# Patient Record
Sex: Female | Born: 1985 | Race: White | Hispanic: Yes | Smoking: Never smoker
Health system: Southern US, Community
[De-identification: ages and names within clinical notes are randomized; demographics above are authoritative.]

## PROBLEM LIST (undated history)

## (undated) DIAGNOSIS — Z8632 Personal history of gestational diabetes: Secondary | ICD-10-CM

## (undated) DIAGNOSIS — Z98891 History of uterine scar from previous surgery: Secondary | ICD-10-CM

## (undated) DIAGNOSIS — Z862 Personal history of diseases of the blood and blood-forming organs and certain disorders involving the immune mechanism: Secondary | ICD-10-CM

## (undated) DIAGNOSIS — A630 Anogenital (venereal) warts: Secondary | ICD-10-CM

## (undated) DIAGNOSIS — O98319 Other infections with a predominantly sexual mode of transmission complicating pregnancy, unspecified trimester: Secondary | ICD-10-CM

## (undated) DIAGNOSIS — I8390 Asymptomatic varicose veins of unspecified lower extremity: Secondary | ICD-10-CM

## (undated) DIAGNOSIS — O24419 Gestational diabetes mellitus in pregnancy, unspecified control: Secondary | ICD-10-CM

## (undated) DIAGNOSIS — R7611 Nonspecific reaction to tuberculin skin test without active tuberculosis: Secondary | ICD-10-CM

## (undated) HISTORY — DX: Nonspecific reaction to tuberculin skin test without active tuberculosis: R76.11

## (undated) HISTORY — DX: Other infections with a predominantly sexual mode of transmission complicating pregnancy, unspecified trimester: O98.319

## (undated) HISTORY — PX: OTHER SURGICAL HISTORY: SHX169

## (undated) HISTORY — DX: Anogenital (venereal) warts: A63.0

## (undated) HISTORY — DX: History of uterine scar from previous surgery: Z98.891

## (undated) HISTORY — DX: Gestational diabetes mellitus in pregnancy, unspecified control: O24.419

## (undated) HISTORY — DX: Asymptomatic varicose veins of unspecified lower extremity: I83.90

## (undated) HISTORY — DX: Personal history of diseases of the blood and blood-forming organs and certain disorders involving the immune mechanism: Z86.2

## (undated) HISTORY — DX: Personal history of gestational diabetes: Z86.32

---

## 2013-01-30 LAB — SICKLE CELL SCREEN: Sickle Cell Screen: NORMAL

## 2013-01-30 LAB — OB RESULTS CONSOLE VARICELLA ZOSTER ANTIBODY, IGG: Varicella: IMMUNE

## 2013-01-30 LAB — OB RESULTS CONSOLE RUBELLA ANTIBODY, IGM: Rubella: IMMUNE

## 2013-02-05 ENCOUNTER — Ambulatory Visit: Payer: Self-pay | Admitting: Family Medicine

## 2013-02-19 ENCOUNTER — Ambulatory Visit: Payer: Self-pay | Admitting: Family Medicine

## 2013-02-22 ENCOUNTER — Encounter: Payer: Self-pay | Admitting: Maternal & Fetal Medicine

## 2013-07-18 ENCOUNTER — Ambulatory Visit: Payer: Self-pay | Admitting: Advanced Practice Midwife

## 2013-08-08 ENCOUNTER — Ambulatory Visit: Payer: Self-pay | Admitting: Advanced Practice Midwife

## 2013-08-20 DIAGNOSIS — O24410 Gestational diabetes mellitus in pregnancy, diet controlled: Secondary | ICD-10-CM | POA: Insufficient documentation

## 2013-08-20 DIAGNOSIS — R7611 Nonspecific reaction to tuberculin skin test without active tuberculosis: Secondary | ICD-10-CM

## 2013-08-20 HISTORY — DX: Nonspecific reaction to tuberculin skin test without active tuberculosis: R76.11

## 2014-01-15 IMAGING — CR DG CHEST 1V
1 series · 1 of 1 positions shown · non-contrast
Comparison: none

REASON FOR EXAM: positive PPD / PREGNANT PT PLS SHIELD
COMMENTS:

[w chest pa]
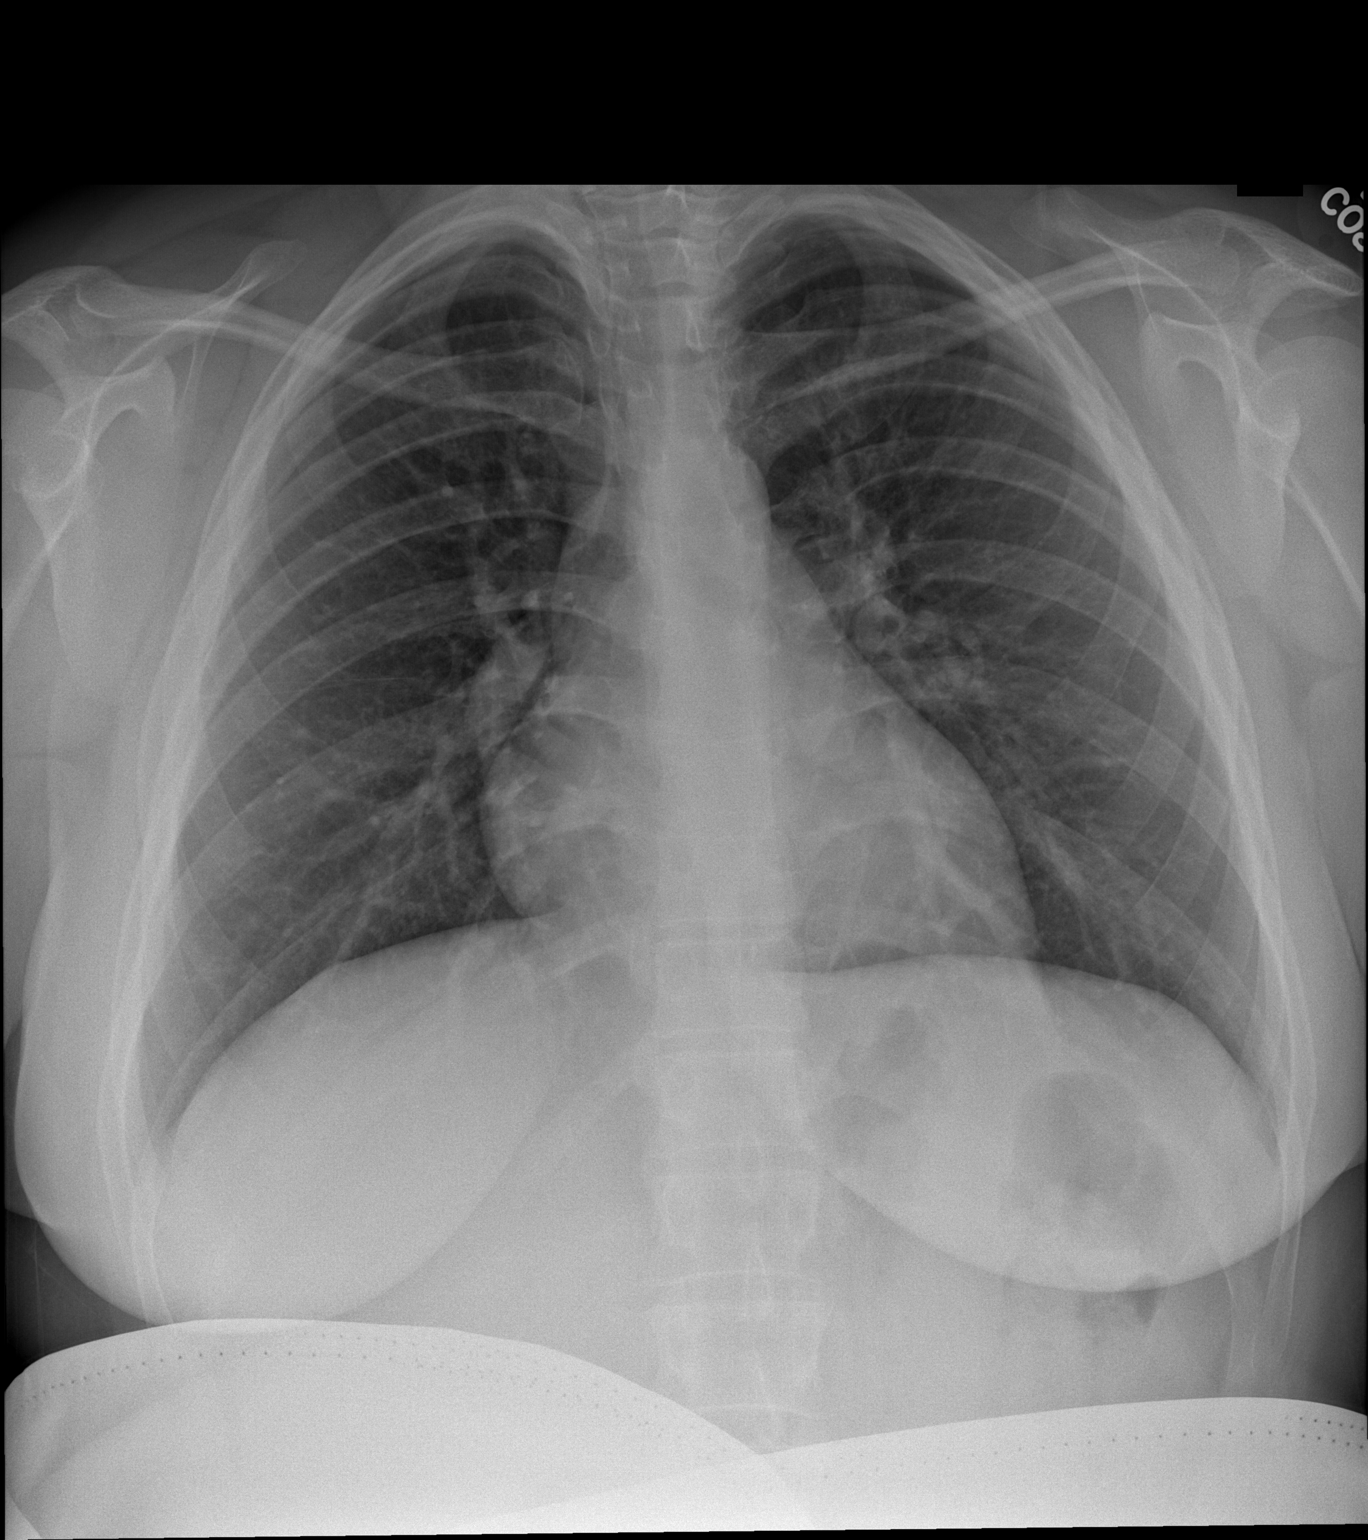

[1 of 1 positions shown; findings below may reference images not displayed]

PROCEDURE:     DXR - DXR CHEST 1 VIEWAP OR PA  - February 19, 2013  [DATE]

RESULT:     The lungs are adequately inflated. The interstitial markings are
coarse in the infrahilar region. There is no pleural effusion or alveolar
infiltrate. The cardiac silhouette is normal in size. The pulmonary
vascularity is not engorged.
IMPRESSION: There is no focal infiltrate. Coarse infrahilar lung
markings especially on the left are present and are nonspecific. A followup
PA and lateral chest x-ray would be of value.

[REDACTED]

## 2014-01-18 IMAGING — US US OB NUCHAL TRANSLUCENCY 1ST GEST - [facility] NRPT
1 series · 14 of 28 positions shown · non-contrast
Comparison: none

[Series 1: us ob nuchal translucency 1st gest - [facility] nrpt · 0.11mm/px · 14 of 35 slices shown]
[im 2/35]
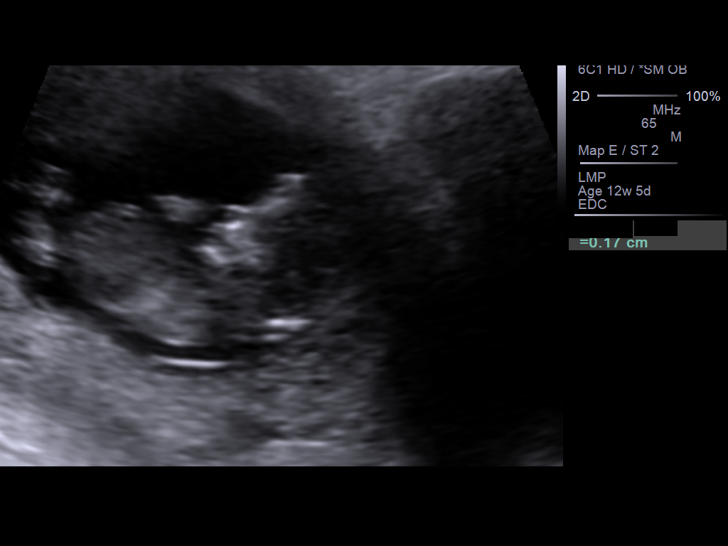
[im 4/35]
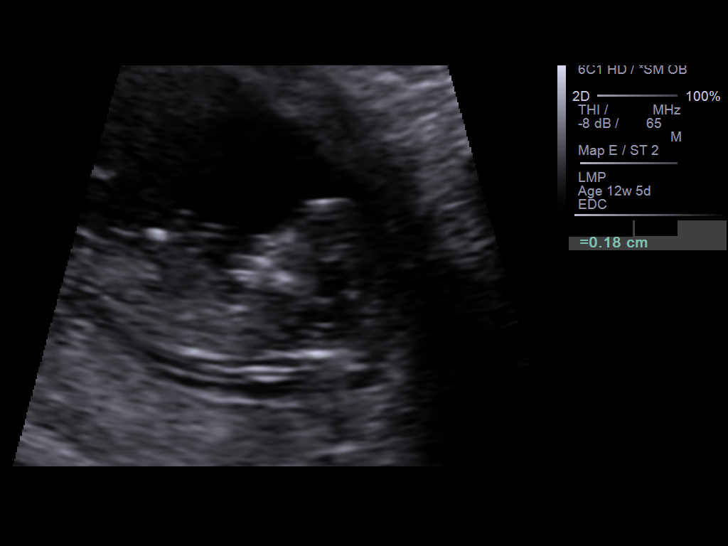
[im 7/35]
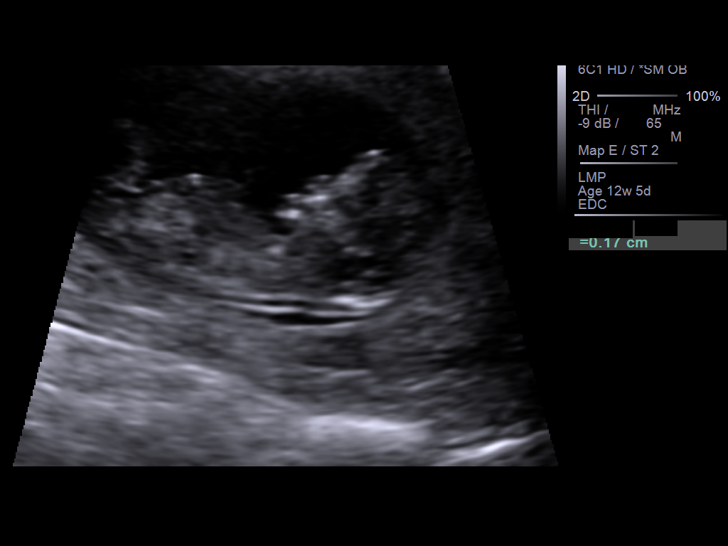
[im 9/35]
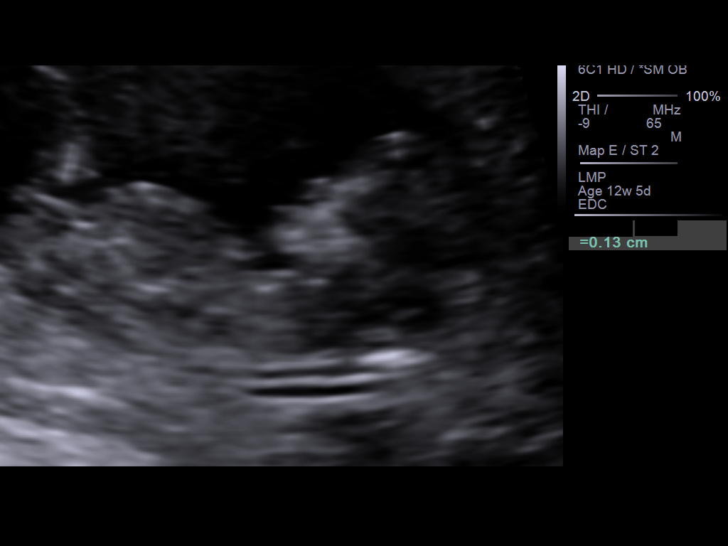
[im 12/35]
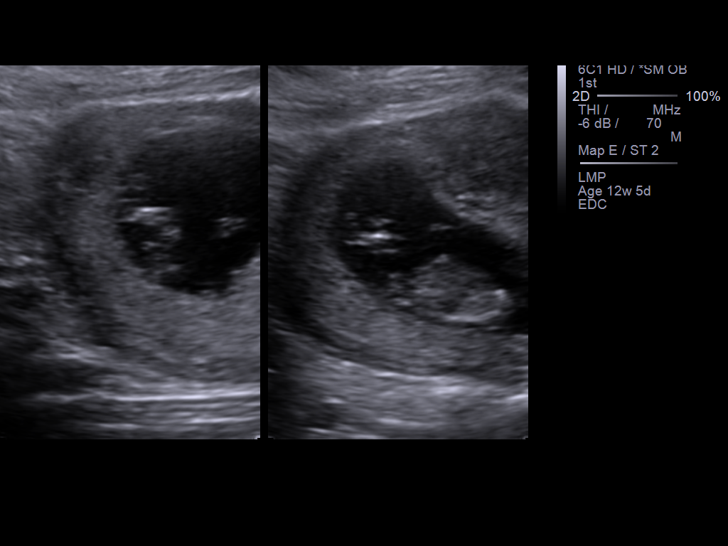
[im 14/35]
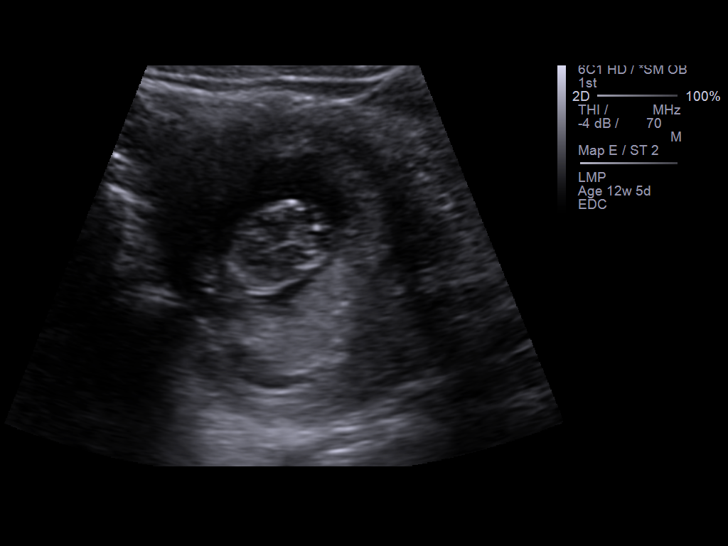
[im 17/35]
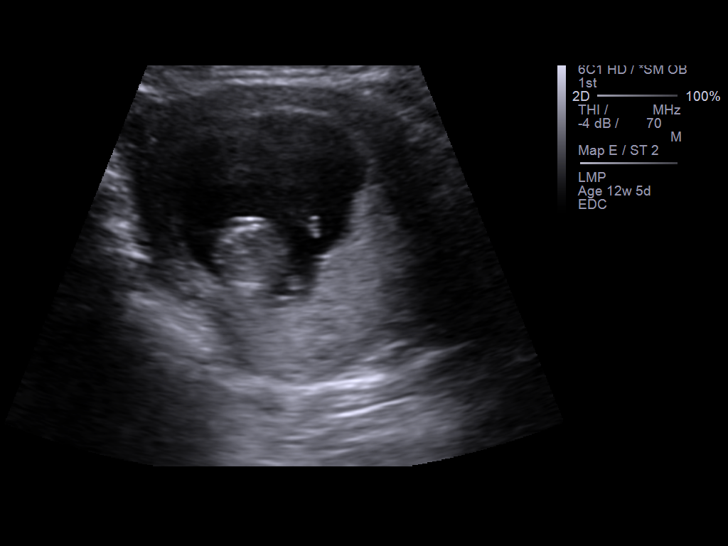
[im 19/35]
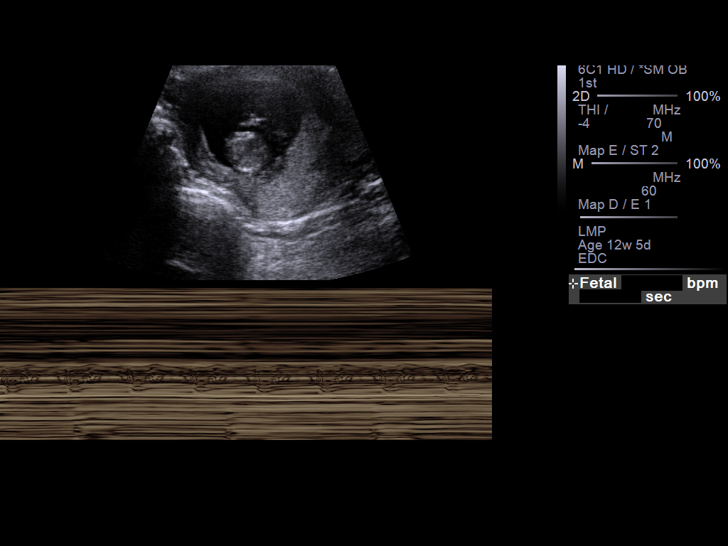
[im 22/35]
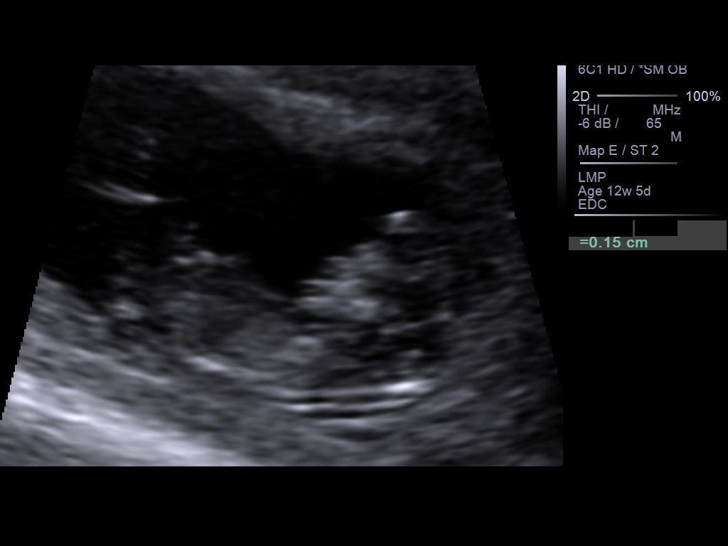
[im 24/35]
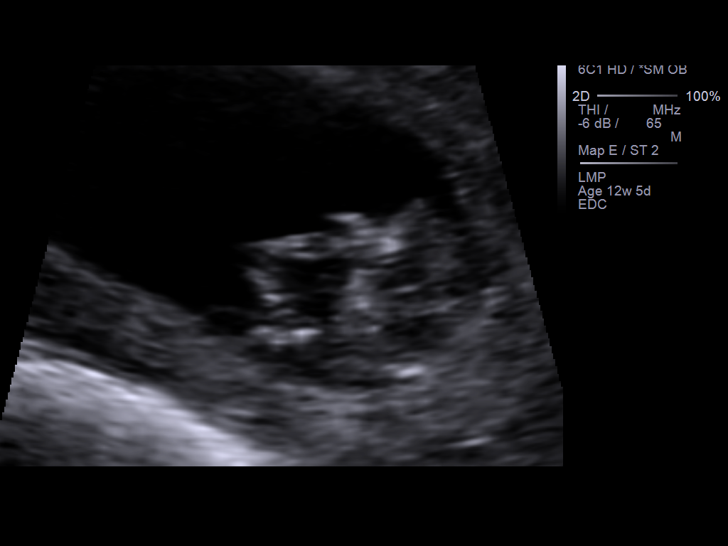
[im 27/35]
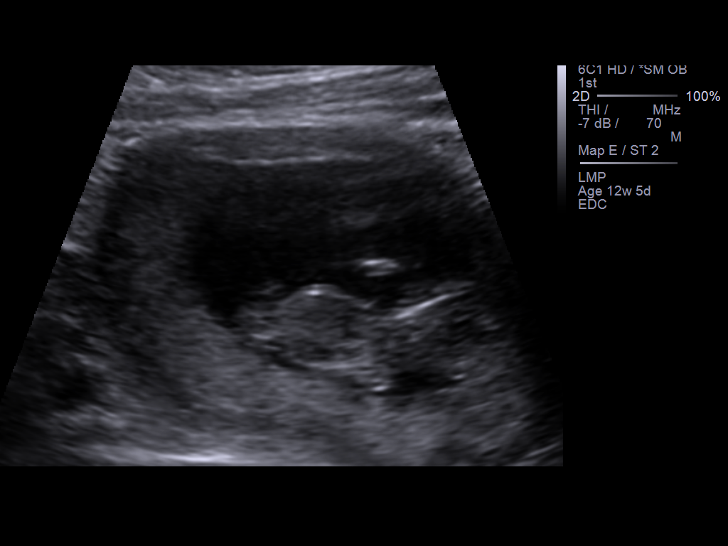
[im 29/35]
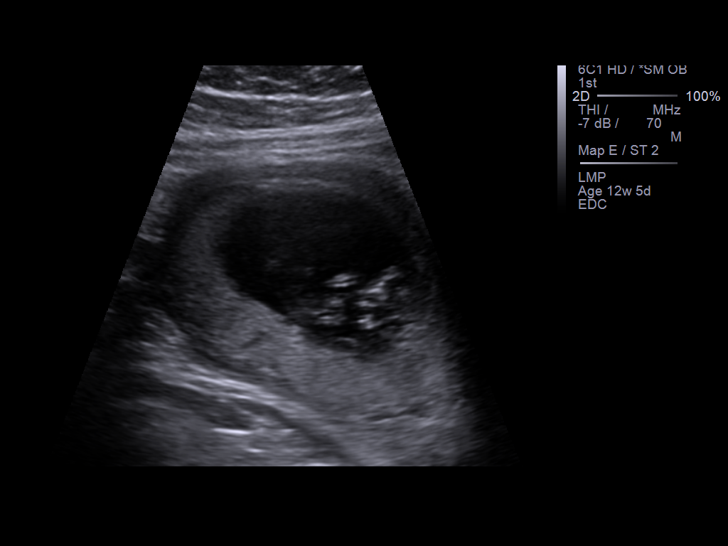
[im 32/35]
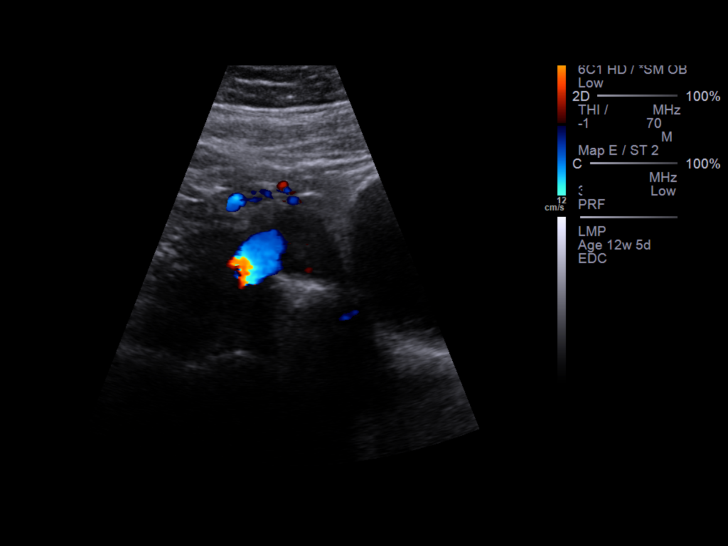
[im 35/35]
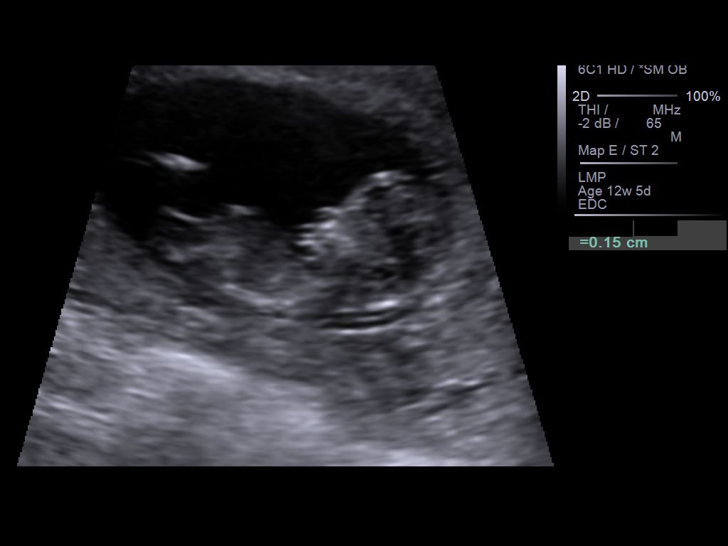

[14 of 28 positions shown; findings below may reference images not displayed]

IMAGES IMPORTED FROM THE SYNGO WORKFLOW SYSTEM
NO DICTATION FOR STUDY

## 2014-11-27 DIAGNOSIS — E669 Obesity, unspecified: Secondary | ICD-10-CM | POA: Insufficient documentation

## 2017-02-11 LAB — HM PAP SMEAR: HM Pap smear: NEGATIVE

## 2017-03-09 ENCOUNTER — Ambulatory Visit
Admission: RE | Admit: 2017-03-09 | Discharge: 2017-03-09 | Disposition: A | Discharge status: Home / Self Care | Payer: Self-pay | Source: Ambulatory Visit | Attending: Oncology | Admitting: Oncology

## 2017-03-09 ENCOUNTER — Ambulatory Visit: Payer: Self-pay | Attending: Oncology

## 2017-03-09 ENCOUNTER — Encounter (INDEPENDENT_AMBULATORY_CARE_PROVIDER_SITE_OTHER): Payer: Self-pay

## 2017-03-09 VITALS — BP 115/74 | HR 68 | Temp 97.2°F | Resp 18 | Ht 63.0 in | Wt 190.0 lb

## 2017-03-09 DIAGNOSIS — N644 Mastodynia: Secondary | ICD-10-CM

## 2017-03-09 NOTE — Progress Notes (Signed)
Subjective:     Patient ID: Jae DireKarina Del Rosario Stockard, female   DOB: 01/01/1986, 31 y.o.   MRN: [id number]  HPI   Review of Systems     Objective:   Physical Exam  Pulmonary/Chest: Right breast exhibits no inverted nipple, no mass, no nipple discharge, no skin change and no tenderness. Left breast exhibits tenderness. Left breast exhibits no inverted nipple, no mass, no nipple discharge and no skin change. Breasts are symmetrical.       Assessment:  31 year old hispanic patient presents for [facility] clinic visit.  [facility]. Patient screened, and meets [facility] eligibility.  Patient does not have insurance, Medicare or Medicaid.  Handout given on Affordable Care Act.  Instructed patient on breast self-exam using teach back method. Patient reports she has had pain in lower left breast x 3 months.  Describes as intermittent, but not cyclical.  No mass or lump palpated on exam.  Symmetrical fibroglandular breast tissue lower breasts.     Plan:     Sent for bilateral baseline diagnostic mammogram, and ultrasound.

## 2018-03-31 LAB — HM HIV SCREENING LAB: HM HIV Screening: NEGATIVE

## 2019-01-03 ENCOUNTER — Other Ambulatory Visit: Payer: Self-pay

## 2019-01-03 DIAGNOSIS — Z20822 Contact with and (suspected) exposure to covid-19: Secondary | ICD-10-CM

## 2019-01-04 LAB — SPECIMEN STATUS REPORT

## 2019-01-04 LAB — NOVEL CORONAVIRUS, NAA: SARS-CoV-2, NAA: NOT DETECTED

## 2019-05-10 ENCOUNTER — Ambulatory Visit: Payer: Self-pay | Attending: Internal Medicine

## 2019-05-10 DIAGNOSIS — Z20828 Contact with and (suspected) exposure to other viral communicable diseases: Secondary | ICD-10-CM | POA: Insufficient documentation

## 2019-05-10 DIAGNOSIS — Z20822 Contact with and (suspected) exposure to covid-19: Secondary | ICD-10-CM

## 2019-05-14 ENCOUNTER — Telehealth: Payer: Self-pay | Admitting: *Deleted

## 2019-05-14 NOTE — Telephone Encounter (Signed)
Patient called for results with assistance from interpreter Katie # [phone], advised results are still pending.

## 2019-05-15 ENCOUNTER — Telehealth: Payer: Self-pay

## 2019-05-15 NOTE — Telephone Encounter (Signed)
Pt called for results- pt was tested 05/10/19 at [facility]. Pt informed that specimens done on 05/10/19 was not picked up and pt must retest. Offered to make pt appointment but refused. Advised pt that [facility] and [facility] are doing rapid testing but she would need to make appt. Used Interpreter # [id number]. Pt verbalized understanding.

## 2019-05-15 NOTE — Telephone Encounter (Signed)
Caller advise that result not back yet  

## 2019-05-16 LAB — NOVEL CORONAVIRUS, NAA

## 2019-10-05 ENCOUNTER — Ambulatory Visit (LOCAL_COMMUNITY_HEALTH_CENTER): Payer: Self-pay

## 2019-10-05 ENCOUNTER — Other Ambulatory Visit: Payer: Self-pay

## 2019-10-05 VITALS — BP 125/79 | Ht 63.0 in | Wt 195.0 lb

## 2019-10-05 DIAGNOSIS — Z3201 Encounter for pregnancy test, result positive: Secondary | ICD-10-CM

## 2019-10-05 LAB — PREGNANCY, URINE: Preg Test, Ur: POSITIVE — AB

## 2019-10-05 MED ORDER — PRENATAL VITAMIN 27-0.8 MG PO TABS: 1.0000 | ORAL_TABLET | Freq: Every day | ORAL | 0 refills | Status: DC

## 2019-10-05 NOTE — Progress Notes (Signed)
Plans prenatal care at [facility] and plans preadmission today. Jossie Ng, RN

## 2019-10-27 ENCOUNTER — Emergency Department
Admission: EM | Admit: 2019-10-27 | Discharge: 2019-10-27 | Disposition: A | Discharge status: Home / Self Care | Payer: Self-pay | Attending: Emergency Medicine | Admitting: Emergency Medicine

## 2019-10-27 ENCOUNTER — Other Ambulatory Visit: Payer: Self-pay

## 2019-10-27 DIAGNOSIS — O98311 Other infections with a predominantly sexual mode of transmission complicating pregnancy, first trimester: Secondary | ICD-10-CM | POA: Insufficient documentation

## 2019-10-27 DIAGNOSIS — Z3A10 10 weeks gestation of pregnancy: Secondary | ICD-10-CM | POA: Insufficient documentation

## 2019-10-27 DIAGNOSIS — Z79899 Other long term (current) drug therapy: Secondary | ICD-10-CM | POA: Insufficient documentation

## 2019-10-27 DIAGNOSIS — A630 Anogenital (venereal) warts: Secondary | ICD-10-CM | POA: Insufficient documentation

## 2019-10-27 DIAGNOSIS — R300 Dysuria: Secondary | ICD-10-CM | POA: Insufficient documentation

## 2019-10-27 LAB — CHLAMYDIA/NGC RT PCR (ARMC ONLY)
Chlamydia Tr: NOT DETECTED
N gonorrhoeae: NOT DETECTED

## 2019-10-27 LAB — POCT PREGNANCY, URINE: Preg Test, Ur: POSITIVE — AB

## 2019-10-27 LAB — WET PREP, GENITAL
Clue Cells Wet Prep HPF POC: NONE SEEN
Sperm: NONE SEEN
Trich, Wet Prep: NONE SEEN
Yeast Wet Prep HPF POC: NONE SEEN

## 2019-10-27 LAB — URINALYSIS, COMPLETE (UACMP) WITH MICROSCOPIC
Bacteria, UA: NONE SEEN
Bilirubin Urine: NEGATIVE
Glucose, UA: NEGATIVE mg/dL
Hgb urine dipstick: NEGATIVE
Ketones, ur: NEGATIVE mg/dL
Leukocytes,Ua: NEGATIVE
Nitrite: NEGATIVE
Protein, ur: NEGATIVE mg/dL
Specific Gravity, Urine: 1.017 (ref 1.005–1.030)
pH: 6 (ref 5.0–8.0)

## 2019-10-27 NOTE — ED Provider Notes (Signed)
[facility] Emergency Department Provider Note  ____________________________________________   First MD Initiated Contact with Patient 10/27/19 1155     (approximate)  I have reviewed the triage vital signs and the nursing notes.   HISTORY  Chief Complaint Dysuria    HPI Sierra Dunlap is a 34 y.o. female presents emergency department complaining of pimples in the vaginal area.  States some itching and burning when she urinates.  Patient is [redacted] weeks pregnant.  No vaginal bleeding or cramping.  States she has a history of genital warts but that the doctor had gotten rid of them several years ago.  She denies fever or chills.    Past Medical History:  Diagnosis Date  . History of anemia    during pregnancy  . History of C-section    Scheduled due to size of baby from GDM  . History of gestational diabetes   . 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   Past Surgical History:  Procedure Laterality Date  . CESAREAN SECTION      Prior to Admission medications   Medication Sig Start Date End Date Taking? Authorizing Provider  Prenatal Vit-Fe Fumarate-FA (PRENATAL VITAMIN) 27-0.8 MG TABS Take 1 tablet by mouth daily at 6 (six) AM. 10/05/19   Federico Flake, MD    Allergies Patient has no known allergies.  Family History  Problem Relation Age of Onset  . Breast cancer Neg Hx     Social History Social History   Tobacco Use  . Smoking status: Never Smoker  . Smokeless tobacco: Never Used  Vaping Use  . Vaping Use: Never used  Substance Use Topics  . Alcohol use: Not Currently    Comment: Last ETOH use one year ago.  . Drug use: Never    Review of Systems  Constitutional: No fever/chills Eyes: No visual changes. ENT: No sore throat. Respiratory: Denies cough Cardiovascular: Denies chest pain Gastrointestinal: Denies abdominal  pain Genitourinary: Positive for dysuria.  Positive for bumps in the vaginal area Musculoskeletal: Negative for back pain. Skin: Negative for rash. Psychiatric: no mood changes,     ____________________________________________   PHYSICAL EXAM:  VITAL SIGNS: ED Triage Vitals  Enc Vitals Group     BP 10/27/19 1119 123/67     Pulse Rate 10/27/19 1119 75     Resp 10/27/19 1119 18     Temp 10/27/19 1119 98.8 F (37.1 C)     Temp Source 10/27/19 1119 Oral     SpO2 10/27/19 1119 99 %     Weight 10/27/19 1130 195 lb (88.5 kg)     Height 10/27/19 1130 5\' 2"  (1.575 m)     Head Circumference --      Peak Flow --      Pain Score 10/27/19 1130 7     Pain Loc --      Pain Edu? --      Excl. in GC? --     Constitutional: Alert and oriented. Well appearing and in no acute distress. Eyes: Conjunctivae are normal.  Head: Atraumatic. Nose: No congestion/rhinnorhea. Mouth/Throat: Mucous membranes are moist.   Neck:  supple no lymphadenopathy noted Cardiovascular: Normal rate, regular rhythm. Respiratory: Normal respiratory effort.  No retractions, Abd: soft nontender bs normal all 4 quad GU: External pelvic exam shows some warts along the perineum and heading towards the anal area.  No vesicles noted  in this area.  Right at the entrance of the vagina there are some sores which I did culture for herpes.  There is a yellowish discharge noted.  No cervical tenderness noted Musculoskeletal: FROM all extremities, warm and well perfused Neurologic:  Normal speech and language.  Skin:  Skin is warm, dry and intact. No rash noted. Psychiatric: Mood and affect are normal. Speech and behavior are normal.  ____________________________________________   LABS (all labs ordered are listed, but only abnormal results are displayed)  Labs Reviewed  WET PREP, GENITAL - Abnormal; Notable for the following components:      Result Value   WBC, Wet Prep HPF POC MANY (*)    All other components within  normal limits  URINALYSIS, COMPLETE (UACMP) WITH MICROSCOPIC - Abnormal; Notable for the following components:   Color, Urine YELLOW (*)    APPearance CLEAR (*)    All other components within normal limits  POCT PREGNANCY, URINE - Abnormal; Notable for the following components:   Preg Test, Ur POSITIVE (*)    All other components within normal limits  CHLAMYDIA/NGC RT PCR ([facility] ONLY)  HSV CULTURE AND TYPING  POC URINE PREG, ED   ____________________________________________   ____________________________________________  RADIOLOGY    ____________________________________________   PROCEDURES  Procedure(s) performed: No  Procedures    ____________________________________________   INITIAL IMPRESSION / ASSESSMENT AND PLAN / ED COURSE  Pertinent labs & imaging results that were available during my care of the patient were reviewed by me and considered in my medical decision making (see chart for details).   Patient is a 34 year old female who is [redacted] weeks pregnant presents emergency department with dysuria and vaginal sores.  See HPI  Physical exam shows some genital warts in the perineum and anal area.  Some other source noted at the entrance to the vagina which were cultured for herpes.  A scant yellow discharge is also noted.  GC/chlamydia ordered, wet prep, herpes culture and typing,  POC pregnancy is positive, urinalysis is normal  Wet prep results show white blood cells only For GC/chlamydia is negative Herpes culture is pending  I did explain the findings to the patient.  She is aware she has genital warts.  She should follow-up with [facility] who will be giving her prenatal care to address the genital warts.  For the itching she can take over-the-counter Benadryl, use ice pack if needed.  She states she understands.    Sierra Dunlap was evaluated in Emergency Department on 10/27/2019 for the symptoms described in the  history of present illness. She was evaluated in the context of the global COVID-19 pandemic, which necessitated consideration that the patient might be at risk for infection with the SARS-CoV-2 virus that causes COVID-19. Institutional protocols and algorithms that pertain to the evaluation of patients at risk for COVID-19 are in a state of rapid change based on information released by regulatory bodies including the CDC and federal and state organizations. These policies and algorithms were followed during the patient's care in the ED.   As part of my medical decision making, I reviewed the following data within the [place] notes reviewed and incorporated, Interpreter needed, Labs reviewed , Old chart reviewed, Notes from prior ED visits and  Controlled Substance Database  ____________________________________________   FINAL CLINICAL IMPRESSION(S) / ED DIAGNOSES  Final diagnoses:  Genital warts      NEW MEDICATIONS STARTED DURING THIS VISIT:  Discharge Medication List as  of 10/27/2019  2:25 PM       Note:  This document was prepared using Dragon voice recognition software and may include unintentional dictation errors.    Faythe Ghee, PA-C 10/27/19 1511    Chesley Noon, MD 10/28/19 [phone]

## 2019-10-27 NOTE — ED Notes (Signed)
Spanish interpreter at bedside with PA-C.

## 2019-10-27 NOTE — ED Notes (Signed)
Will defer physical assessment to PA-C. Pelvic exam performed with female ED tech as chaperone.

## 2019-10-27 NOTE — ED Triage Notes (Signed)
Pt states she has "a lot of pimples to vaginal area". States [redacted] weeks pregnant. States burns when she urinates. A&O, ambulatory. Denies vaginal bleeding or discharge. Sees OB 7/6.

## 2019-10-30 LAB — HSV CULTURE AND TYPING

## 2019-11-08 NOTE — Progress Notes (Signed)
Record abstracted per [facility] record and 11/06/19 phone interview R. Financial risk analyst, RN; Sharlette Dense, RN

## 2019-11-13 ENCOUNTER — Encounter: Payer: Self-pay | Admitting: Advanced Practice Midwife

## 2019-11-13 ENCOUNTER — Ambulatory Visit: Payer: Medicaid Other | Admitting: Advanced Practice Midwife

## 2019-11-13 ENCOUNTER — Other Ambulatory Visit: Payer: Self-pay

## 2019-11-13 DIAGNOSIS — O99211 Obesity complicating pregnancy, first trimester: Secondary | ICD-10-CM

## 2019-11-13 DIAGNOSIS — O0991 Supervision of high risk pregnancy, unspecified, first trimester: Secondary | ICD-10-CM

## 2019-11-13 DIAGNOSIS — A630 Anogenital (venereal) warts: Secondary | ICD-10-CM

## 2019-11-13 DIAGNOSIS — E669 Obesity, unspecified: Secondary | ICD-10-CM

## 2019-11-13 DIAGNOSIS — O99210 Obesity complicating pregnancy, unspecified trimester: Secondary | ICD-10-CM | POA: Insufficient documentation

## 2019-11-13 DIAGNOSIS — O09529 Supervision of elderly multigravida, unspecified trimester: Secondary | ICD-10-CM

## 2019-11-13 DIAGNOSIS — Z98891 History of uterine scar from previous surgery: Secondary | ICD-10-CM | POA: Diagnosis not present

## 2019-11-13 DIAGNOSIS — O0990 Supervision of high risk pregnancy, unspecified, unspecified trimester: Secondary | ICD-10-CM | POA: Insufficient documentation

## 2019-11-13 MED ORDER — PRENATAL VITAMINS 28-0.8 MG PO TABS: 1.0000 | ORAL_TABLET | ORAL | 0 refills | Status: AC

## 2019-11-13 NOTE — Progress Notes (Signed)
Here for Initial MH visit at 11.6 weeks. Is taking PNV, but needs more. Reports severe vaginal & rectal itching, lesions. 1hr glucose test.

## 2019-11-13 NOTE — Progress Notes (Signed)
[facility] HEALTH DEPT [facility] [address] [place] [address] [phone]  INITIAL PRENATAL VISIT NOTE  Subjective:  Sierra Dunlap is a 34 y.o.SHF nonsmoker G2P1001 at [redacted]w[redacted]d being seen today to start prenatal care at the [facility] Department. She feels "happy" about surprise pregnancy with no birth control the past 6 years.  34 yo employed FOB feels "happy" about pregnancy; he has a 14 yo daughter who lives with her mom in [place]; FOB is supportive and they have been in a 1 year relationship.  She is working 40 hrs/wk and lives with FOB and her 49 yo son.  Has been to the ER x 1 on 10/27/19 for a flare up of history of HPV; no u/s done.  Last pap 02/11/17 neg HPV neg.  Denies cigarettes, MJ.  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  Patient reports nausea.  Contractions: Not present. Vag. Bleeding: None.  Movement: Absent. Denies leaking of fluid.   Indications for ASA therapy (per uptodate) One of the following: Previous pregnancy with preeclampsia, especially early onset and with an adverse outcome No Multifetal gestation No Chronic hypertension No Type 1 or 2 diabetes mellitus No Chronic kidney disease No Autoimmune disease (antiphospholipid syndrome, systemic lupus erythematosus) No  Two or more of the following: Nulliparity No Obesity (body mass index >30 kg/m2) Yes Family history of preeclampsia in mother or sister  Yes Age ?35 years Yes Sociodemographic characteristics (African American race, low socioeconomic level) No Personal risk factors (eg, previous pregnancy with low birth weight or small for gestational age infant, previous adverse pregnancy outcome [eg, stillbirth], interval >10 years between pregnancies) No   The following portions of the patient's history were reviewed and updated as appropriate: allergies, current medications, past family history, past medical history, past social history, past surgical history and problem list. Problem list updated.  Objective:   Vitals:   11/13/19 1401  BP: 121/72  Pulse: 76  Temp: 98.6 F (37 C)  Weight: 198 lb 3.2 oz (89.9 kg)    Fetal Status: Fetal Heart Rate (bpm): not heard Fundal Height: 10 cm Movement: Absent  Presentation: Undeterminable  Physical Exam Vitals and nursing note reviewed.  Constitutional:      General: She is not in acute distress.    Appearance: Normal appearance. She is well-developed. She is obese.  HENT:     Head: Normocephalic and atraumatic.     Right Ear: External ear normal.     Left Ear: External ear normal.     Nose: Nose normal. No congestion or rhinorrhea.     Mouth/Throat:     Lips: Pink.     Mouth: Mucous membranes are moist.     Dentition: Normal dentition. No dental caries.     Pharynx: Oropharynx is clear. Uvula midline.  Eyes:     General: No scleral icterus.    Conjunctiva/sclera: Conjunctivae normal.  Neck:     Thyroid: No thyroid mass or thyromegaly.  Cardiovascular:  Rate and Rhythm: Normal rate.     Pulses: Normal pulses.     Comments: Extremities are warm and well perfused Pulmonary:     Effort: Pulmonary effort is normal.     Breath sounds: Normal breath sounds.  Chest:     Breasts: Breasts are symmetrical.        Right: Normal. No mass, nipple discharge or skin change.        Left: Normal. No mass, nipple discharge or skin change.  Abdominal:     Palpations: Abdomen is soft.      Tenderness: There is no abdominal tenderness.     Comments: Gravid, soft without tenderness, poor tone, increased adipose, no FHR heard, 10 wks size  Genitourinary:    General: Normal vulva.     Exam position: Lithotomy position.     Pubic Area: No rash.      Labia:        Right: No rash.        Left: No rash.      Vagina: Vaginal discharge (white creamy leukorrhea, ph>4.5) present.     Cervix: Normal.     Uterus: Normal. Enlarged (Gravid 10 wk size). Not tender.      Adnexa: Right adnexa normal and left adnexa normal.     Rectum: Normal. No external hemorrhoid.       Comments: HPV posterior aspect of introitus as well as surrounding anus Musculoskeletal:     Right lower leg: No edema.     Left lower leg: No edema.  Lymphadenopathy:     Upper Body:     Right upper body: No axillary adenopathy.     Left upper body: No axillary adenopathy.  Skin:    General: Skin is warm and dry.     Capillary Refill: Capillary refill takes less than 2 seconds.  Neurological:     Mental Status: She is alert.     Assessment and Plan:  Pregnancy: G2P1001 at [redacted]w[redacted]d  1. Obesity affecting pregnancy in first trimester Agrees to taking ASA 81 mg daily beginning 12 wks  2. Previous cesarean section 09/14/13 failed IOL with arrest of dilitation at 9 cm Desires TOLAC 3. Postpartum hemorrhage, unspecified type   4. Antepartum multigravida of advanced maternal age Desires genetic counseling, FIRST screen 5. Supervision of high risk pregnancy in first trimester C/o nausea--suggestions given for high protein snacks q 2 hours Referral written for FIRST screen with u/s and genetic counseling PHQ-9=9; declines counseling  - HCV Ab w Reflex to Quant PCR - HIV Antibody (routine testing w rflx) - Prenatal profile without Varicella/Rubella (440347) - Comprehensive metabolic panel - TSH - Hgb A1c w/o eAG - Glucose, 1 hour gestational - Protein / creatinine ratio, urine  (Spot) - Chlamydia/GC NAA,  Confirmation - Urine Culture - [id number] Drug Screen - QuantiFERON-TB Gold Plus - WET PREP FOR TRICH, YEAST, CLUE - Hemoglobin, venipuncture - Urinalysis (Urine Dip)  6. Condyloma external  Expectant management    Discussed overview of care and coordination with inpatient delivery practices including [facility], [facility], [facility] and [facility].   Reviewed Centering pregnancy as standard of care at [facility], oriented to room and showed video. Based on EDD, plan for Cycle    Preterm labor symptoms and general obstetric precautions including but not limited to vaginal bleeding, contractions, leaking of fluid and fetal movement were reviewed in detail with the patient.  Please refer to After Visit Summary for other counseling recommendations.   Return in about 4  weeks (around 12/11/2019) for routine PNC.  Future Appointments  Date Time Provider Department Center  12/11/2019 10:00 AM [facility] PROVIDER [facility] None    Alberteen Spindle, CNM

## 2019-11-14 LAB — 789231 7+OXYCODONE-BUND
Amphetamines, Urine: NEGATIVE ng/mL
BENZODIAZ UR QL: NEGATIVE ng/mL
Barbiturate screen, urine: NEGATIVE ng/mL
Cannabinoid Quant, Ur: NEGATIVE ng/mL
Cocaine (Metab.): NEGATIVE ng/mL
OPIATE SCREEN URINE: NEGATIVE ng/mL
Oxycodone/Oxymorphone, Urine: NEGATIVE ng/mL
PCP Quant, Ur: NEGATIVE ng/mL

## 2019-11-14 LAB — WET PREP FOR TRICH, YEAST, CLUE
Trichomonas Exam: NEGATIVE
Yeast Exam: NEGATIVE

## 2019-11-14 LAB — PROTEIN / CREATININE RATIO, URINE
Creatinine, Urine: 120.9 mg/dL
Protein, Ur: 8.6 mg/dL
Protein/Creat Ratio: 71 mg/g creat (ref 0–200)

## 2019-11-14 LAB — URINALYSIS
Bilirubin, UA: NEGATIVE
Glucose, UA: NEGATIVE
Ketones, UA: NEGATIVE
Leukocytes,UA: NEGATIVE
Nitrite, UA: NEGATIVE
Protein,UA: NEGATIVE
RBC, UA: NEGATIVE
Specific Gravity, UA: 1.03 (ref 1.005–1.030)
Urobilinogen, Ur: 0.2 mg/dL (ref 0.2–1.0)
pH, UA: 6 (ref 5.0–7.5)

## 2019-11-14 LAB — HEMOGLOBIN, FINGERSTICK: Hemoglobin: 12.3 g/dL (ref 11.1–15.9)

## 2019-11-15 LAB — URINE CULTURE

## 2019-11-16 LAB — COMPREHENSIVE METABOLIC PANEL
ALT: 17 IU/L (ref 0–32)
AST: 15 IU/L (ref 0–40)
Albumin/Globulin Ratio: 1.5 (ref 1.2–2.2)
Albumin: 4 g/dL (ref 3.8–4.8)
Alkaline Phosphatase: 53 IU/L (ref 48–121)
BUN/Creatinine Ratio: 16 (ref 9–23)
BUN: 7 mg/dL (ref 6–20)
Bilirubin Total: <0.2 mg/dL (ref 0.0–1.2)
CO2: 24 mmol/L (ref 20–29)
Calcium: 8.6 mg/dL — ABNORMAL LOW (ref 8.7–10.2)
Chloride: 103 mmol/L (ref 96–106)
Creatinine, Ser: 0.44 mg/dL — ABNORMAL LOW (ref 0.57–1.00)
GFR calc Af Amer: 151 mL/min/{1.73_m2} (ref >59)
GFR calc non Af Amer: 131 mL/min/{1.73_m2} (ref >59)
Globulin, Total: 2.6 g/dL (ref 1.5–4.5)
Glucose: 146 mg/dL — ABNORMAL HIGH (ref 65–99)
Potassium: 4.1 mmol/L (ref 3.5–5.2)
Sodium: 138 mmol/L (ref 134–144)
Total Protein: 6.6 g/dL (ref 6.0–8.5)

## 2019-11-16 LAB — QUANTIFERON-TB GOLD PLUS
QuantiFERON Mitogen Value: >10 IU/mL
QuantiFERON Nil Value: 0.23 IU/mL
QuantiFERON TB1 Ag Value: 0.34 IU/mL
QuantiFERON TB2 Ag Value: 0.36 IU/mL
QuantiFERON-TB Gold Plus: NEGATIVE

## 2019-11-16 LAB — CBC/D/PLT+RPR+RH+ABO+AB SCR
Antibody Screen: NEGATIVE
Basophils Absolute: 0 10*3/uL (ref 0.0–0.2)
Basos: 0 %
EOS (ABSOLUTE): 0.2 10*3/uL (ref 0.0–0.4)
Eos: 3 %
Hematocrit: 35.5 % (ref 34.0–46.6)
Hemoglobin: 12 g/dL (ref 11.1–15.9)
Hepatitis B Surface Ag: NEGATIVE
Immature Grans (Abs): 0 10*3/uL (ref 0.0–0.1)
Immature Granulocytes: 0 %
Lymphocytes Absolute: 2.4 10*3/uL (ref 0.7–3.1)
Lymphs: 26 %
MCH: 28.8 pg (ref 26.6–33.0)
MCHC: 33.8 g/dL (ref 31.5–35.7)
MCV: 85 fL (ref 79–97)
Monocytes Absolute: 0.5 10*3/uL (ref 0.1–0.9)
Monocytes: 5 %
Neutrophils Absolute: 6.1 10*3/uL (ref 1.4–7.0)
Neutrophils: 66 %
Platelets: 342 10*3/uL (ref 150–450)
RBC: 4.17 x10E6/uL (ref 3.77–5.28)
RDW: 13.4 % (ref 11.7–15.4)
RPR Ser Ql: NONREACTIVE
Rh Factor: POSITIVE
WBC: 9.3 10*3/uL (ref 3.4–10.8)

## 2019-11-16 LAB — HGB A1C W/O EAG: Hgb A1c MFr Bld: 5.8 % — ABNORMAL HIGH (ref 4.8–5.6)

## 2019-11-16 LAB — TSH: TSH: 1.39 u[IU]/mL (ref 0.450–4.500)

## 2019-11-16 LAB — GLUCOSE, 1 HOUR GESTATIONAL: Gestational Diabetes Screen: 126 mg/dL (ref 65–139)

## 2019-11-16 LAB — HCV AB W REFLEX TO QUANT PCR: HCV Ab: <0.1 s/co ratio (ref 0.0–0.9)

## 2019-11-16 LAB — HIV ANTIBODY (ROUTINE TESTING W REFLEX): HIV Screen 4th Generation wRfx: NONREACTIVE

## 2019-11-16 LAB — HCV INTERPRETATION

## 2019-11-17 LAB — CHLAMYDIA/GC NAA, CONFIRMATION
Chlamydia trachomatis, NAA: NEGATIVE
Neisseria gonorrhoeae, NAA: NEGATIVE

## 2019-12-11 ENCOUNTER — Ambulatory Visit: Payer: Self-pay

## 2019-12-12 ENCOUNTER — Other Ambulatory Visit: Payer: Self-pay

## 2019-12-12 ENCOUNTER — Ambulatory Visit: Payer: Medicaid Other | Admitting: Advanced Practice Midwife

## 2019-12-12 VITALS — BP 112/76 | HR 74 | Temp 99.9°F | Wt 202.0 lb

## 2019-12-12 DIAGNOSIS — O09529 Supervision of elderly multigravida, unspecified trimester: Secondary | ICD-10-CM

## 2019-12-12 DIAGNOSIS — O24410 Gestational diabetes mellitus in pregnancy, diet controlled: Secondary | ICD-10-CM | POA: Diagnosis not present

## 2019-12-12 DIAGNOSIS — O0991 Supervision of high risk pregnancy, unspecified, first trimester: Secondary | ICD-10-CM | POA: Diagnosis not present

## 2019-12-12 DIAGNOSIS — O99211 Obesity complicating pregnancy, first trimester: Secondary | ICD-10-CM | POA: Diagnosis not present

## 2019-12-12 DIAGNOSIS — Z98891 History of uterine scar from previous surgery: Secondary | ICD-10-CM | POA: Diagnosis not present

## 2019-12-12 NOTE — Progress Notes (Signed)
Pt is taking PNV. Pt denies visits to ER since last appt at [facility].

## 2019-12-12 NOTE — Progress Notes (Signed)
   PRENATAL VISIT NOTE  Subjective:  Sierra Dunlap is a 34 y.o. G2P1001 at [redacted]w[redacted]d being seen today for ongoing prenatal care.  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  Patient reports no complaints.  Contractions: Not present. Vag. Bleeding: None.  Movement: Absent. Denies leaking of fluid/ROM.   The following portions of the patient's history were reviewed and updated as appropriate: allergies, current medications, past family history, past medical history, past social history, past surgical history and problem list. Problem list updated.  Objective:   Vitals:   12/12/19 1348  BP: 112/76  Pulse: 74  Temp: 99.9 F (37.7 C)  Weight: 202 lb (91.6 kg)    Fetal Status: Fetal Heart Rate (bpm): 160 Fundal Height: 18 cm Movement: Absent     General:  Alert, oriented and cooperative. Patient is in no acute distress.  Skin: Skin is warm and dry. No rash noted.   Cardiovascular: Normal heart rate noted  Respiratory: Normal respiratory effort, no problems with respiration noted  Abdomen: Soft, gravid, appropriate for gestational age.  Pain/Pressure: Absent     Pelvic: Cervical exam deferred        Extremities: Normal range of motion.  Edema: None  Mental Status: Normal mood and affect. Normal behavior. Normal judgment and thought content.   Assessment and Plan:  Pregnancy: G2P1001 at [redacted]w[redacted]d  1. Previous cesarean section 09/14/13 failed IOL with arrest of dilitation @ 9 cm Desires TOLAC with  [facility]  2. Supervision of high risk pregnancy in first trimester Quad screen done today at [facility] u/s today with 3VC, AFI wnl, EFW=153 gm, anterior placenta at 16 3/7 wks Working 48 hrs/wk Genetic counseling done via t/c 12/11/19.   Here with FOB  3. Obesity affecting pregnancy in first trimester 7 lb (3.175 kg)  Taking ASA 81 mg daily 1 hr glucola wnl at last appt=126 on 11/13/19      Preterm labor symptoms and general obstetric precautions including but not limited to vaginal bleeding, contractions, leaking of fluid and fetal movement were reviewed in detail with the patient. Please refer to After Visit Summary for other counseling recommendations.  Return in about 4 weeks (around 01/09/2020) for routine PNC.  Future Appointments  Date Time Provider Department Center  01/09/2020  2:00 PM [facility] PROVIDER [facility] None    Alberteen Spindle, CNM

## 2020-01-09 ENCOUNTER — Other Ambulatory Visit: Payer: Self-pay

## 2020-01-09 ENCOUNTER — Ambulatory Visit: Payer: Self-pay | Admitting: Advanced Practice Midwife

## 2020-01-09 VITALS — BP 118/72 | HR 77 | Temp 98.5°F | Wt 205.2 lb

## 2020-01-09 DIAGNOSIS — O0991 Supervision of high risk pregnancy, unspecified, first trimester: Secondary | ICD-10-CM

## 2020-01-09 DIAGNOSIS — O99211 Obesity complicating pregnancy, first trimester: Secondary | ICD-10-CM

## 2020-01-09 DIAGNOSIS — Z98891 History of uterine scar from previous surgery: Secondary | ICD-10-CM

## 2020-01-09 DIAGNOSIS — O09529 Supervision of elderly multigravida, unspecified trimester: Secondary | ICD-10-CM

## 2020-01-09 NOTE — Progress Notes (Signed)
9

## 2020-01-09 NOTE — Progress Notes (Signed)
   PRENATAL VISIT NOTE  Subjective:  Sierra Dunlap is a 34 y.o. G2P1001 at [redacted]w[redacted]d being seen today for ongoing prenatal care.  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  Patient reports no complaints.  Contractions: Not present. Vag. Bleeding: None.  Movement: Present. Denies leaking of fluid/ROM.   The following portions of the patient's history were reviewed and updated as appropriate: allergies, current medications, past family history, past medical history, past social history, past surgical history and problem list. Problem list updated.  Objective:   Vitals:   01/09/20 1410  BP: 118/72  Pulse: 77  Temp: 98.5 F (36.9 C)  Weight: 205 lb 3.2 oz (93.1 kg)    Fetal Status: Fetal Heart Rate (bpm): 160 Fundal Height: 20 cm Movement: Present     General:  Alert, oriented and cooperative. Patient is in no acute distress.  Skin: Skin is warm and dry. No rash noted.   Cardiovascular: Normal heart rate noted  Respiratory: Normal respiratory effort, no problems with respiration noted  Abdomen: Soft, gravid, appropriate for gestational age.  Pain/Pressure: Absent     Pelvic: Cervical exam deferred        Extremities: Normal range of motion.  Edema: None  Mental Status: Normal mood and affect. Normal behavior. Normal judgment and thought content.   Assessment and Plan:  Pregnancy: G2P1001 at [redacted]w[redacted]d  1. Supervision of high risk pregnancy in first trimester 11/13/19 1 hour glucola=126 Reviewed  [facility] u/s 12/12/19 at 16 3/7 wks with 3VC, anterior placenta, AFI wnl No FIRST  screen results from 12/12/19 yet in Epic--neg Needs AFP only  2. Antepartum multigravida of advanced maternal age 25   3. Obesity affecting pregnancy in first trimester 10 lb 3.2 oz (4.627 kg) Not exercising--encouraged to do so Taking ASA 81 mg daily   4. Previous cesarean section 09/14/13 failed IOL with arrest of dilitation @ 9 cm Wants [facility] at [facility]    Preterm labor symptoms and general obstetric precautions including but not limited to vaginal bleeding, contractions, leaking of fluid and fetal movement were reviewed in detail with the patient. Please refer to After Visit Summary for other counseling recommendations.  Return in about 4 weeks (around 02/06/2020) for routine PNC.  No future appointments.  Alberteen Spindle, CNM

## 2020-01-10 ENCOUNTER — Telehealth: Payer: Self-pay

## 2020-01-10 NOTE — Telephone Encounter (Signed)
Per pink sticky note in Epic, client had quad screen drawn 12/12/2019. Blood obtained 12/12/2019 at [facility] was for [employer] NIPS with negative results. Call to client to offer AFP only and unable to return until 01/15/2020 - appt scheduled. Jossie Ng, RN

## 2020-01-15 ENCOUNTER — Other Ambulatory Visit: Payer: Self-pay

## 2020-01-20 ENCOUNTER — Other Ambulatory Visit: Payer: Self-pay

## 2020-01-20 ENCOUNTER — Observation Stay: Payer: Self-pay

## 2020-01-20 ENCOUNTER — Observation Stay
Admission: EM | Admit: 2020-01-20 | Discharge: 2020-01-20 | Disposition: A | Discharge status: Home / Self Care | Payer: Self-pay | Attending: Certified Nurse Midwife | Admitting: Certified Nurse Midwife

## 2020-01-20 ENCOUNTER — Encounter: Payer: Self-pay | Admitting: Obstetrics and Gynecology

## 2020-01-20 DIAGNOSIS — O24419 Gestational diabetes mellitus in pregnancy, unspecified control: Secondary | ICD-10-CM | POA: Insufficient documentation

## 2020-01-20 DIAGNOSIS — M545 Low back pain: Secondary | ICD-10-CM | POA: Insufficient documentation

## 2020-01-20 DIAGNOSIS — M549 Dorsalgia, unspecified: Secondary | ICD-10-CM | POA: Diagnosis present

## 2020-01-20 DIAGNOSIS — Z3A21 21 weeks gestation of pregnancy: Secondary | ICD-10-CM | POA: Insufficient documentation

## 2020-01-20 DIAGNOSIS — O99891 Other specified diseases and conditions complicating pregnancy: Principal | ICD-10-CM | POA: Insufficient documentation

## 2020-01-20 DIAGNOSIS — Z7982 Long term (current) use of aspirin: Secondary | ICD-10-CM | POA: Insufficient documentation

## 2020-01-20 HISTORY — DX: Dorsalgia, unspecified: M54.9

## 2020-01-20 LAB — URINALYSIS, ROUTINE W REFLEX MICROSCOPIC
Bacteria, UA: NONE SEEN
Bilirubin Urine: NEGATIVE
Glucose, UA: NEGATIVE mg/dL
Ketones, ur: 5 mg/dL — AB
Leukocytes,Ua: NEGATIVE
Nitrite: NEGATIVE
Protein, ur: NEGATIVE mg/dL
RBC / HPF: >50 RBC/hpf — ABNORMAL HIGH (ref 0–5)
Specific Gravity, Urine: 1.018 (ref 1.005–1.030)
pH: 6 (ref 5.0–8.0)

## 2020-01-20 MED ORDER — ACETAMINOPHEN 500 MG PO TABS: 1000.0000 mg | ORAL_TABLET | Freq: Once | ORAL | Status: AC

## 2020-01-20 MED ORDER — ACETAMINOPHEN 500 MG PO TABS: 1000.0000 mg | ORAL_TABLET | Freq: Four times a day (QID) | ORAL | 0 refills | Status: AC | PRN

## 2020-01-20 MED ORDER — ACETAMINOPHEN 500 MG PO TABS
ORAL_TABLET | ORAL | Status: AC
  Administered 2020-01-20: 1000 mg via ORAL
  Filled 2020-01-20: qty 2

## 2020-01-20 NOTE — Discharge Summary (Signed)
Sierra Dunlap is a 34 y.o. female. She is at [redacted]w[redacted]d gestation. Patient's last menstrual period was 08/22/2019 (exact date). Estimated Date of Delivery: 05/28/20  Prenatal care site:  [facility]   Chief complaint: abdominal and back pain Location: b/l back and abdomen Onset/timing: last night 2300 Duration: constant Quality: sharp Severity: moderate to severe Aggravating or alleviating conditions: none Associated signs/symptoms: pain during urination, blood spotting with urination Context: Patient reports a pain with abrupt onset around 2300 last night that woke her from her sleep. It was a sharp pain in her b/l lower back and wrapping around to her abdomen. Nothing in particular seems to make it better or worse, though it is improved now compared to what is was a couple of hours ago. She does report some blood spotting with wiping while using the bathroom and concerns for UTI, as she has pain with urination.   S: Resting comfortably.   She reports:  -active fetal movement -no leakage of fluid -no vaginal bleeding, some bloody spotting with wiping after urinating -no contractions  Maternal Medical History:   Past Medical History:  Diagnosis Date  . Genital warts complicating pregnancy, unspecified trimester    per 10/2019 ED visit  . Gestational diabetes    2015 pregnancy  . History of anemia    during pregnancy  . History of C-section    Scheduled due to size of baby from GDM  . History of gestational diabetes   . History of positive PPD, untreated    01/2013 PPD 86mm/Chest xray Neg/ Tx incomplete  . 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 Varicosities of leg     Past Surgical History:  Procedure Laterality Date  . CESAREAN SECTION     09/2013 @ [facility]  . History of unknown lap surgery     [place] 7 yr[place]    No Known Allergies  Prior to Admission medications   Medication Sig Start Date End Date Taking? Authorizing Provider   aspirin 81 MG chewable tablet Chew 81 mg by mouth daily.   Yes [provider]  Prenatal Vit-Fe Fumarate-FA (MULTIVITAMIN-PRENATAL) 27-0.8 MG TABS tablet Take 1 tablet by mouth daily at 12 noon.   Yes [provider]     Social History: She  reports that she has never smoked. She has never used smokeless tobacco. She reports previous alcohol use. She reports that she does not use drugs.  Family History: family history includes Diabetes in her mother; Heart disease in her maternal grandmother; Hypertension in her maternal grandmother; Thyroid disease in her mother.  Review of Systems: A full review of systems was performed and negative except as noted in the HPI.     O:  BP 123/67 (BP Location: Left Arm)   Pulse 73   Temp 98.7 F (37.1 C) (Oral)   Resp 18   LMP 08/22/2019 (Exact Date)  Results for orders placed or performed during the hospital encounter of 01/20/20 (from the past 48 hour(s))  Urinalysis, Routine w reflex microscopic Urine, Clean Catch   Collection Time: 01/20/20  1:56 AM  Result Value Ref Range   Color, Urine YELLOW (A) YELLOW   APPearance HAZY (A) CLEAR   Specific Gravity, Urine 1.018 1.005 - 1.030   pH 6.0 5.0 - 8.0   Glucose, UA NEGATIVE NEGATIVE mg/dL   Hgb urine dipstick LARGE (A) NEGATIVE   Bilirubin Urine NEGATIVE NEGATIVE   Ketones, ur 5 (A) NEGATIVE  mg/dL   Protein, ur NEGATIVE NEGATIVE mg/dL   Nitrite NEGATIVE NEGATIVE   Leukocytes,Ua NEGATIVE NEGATIVE   RBC / HPF >50 (H) 0 - 5 RBC/hpf   WBC, UA 6-10 0 - 5 WBC/hpf   Bacteria, UA NONE SEEN NONE SEEN   Squamous Epithelial / LPF 0-5 0 - 5   Mucus PRESENT      Constitutional: NAD, AAOx3  HE/ENT: extraocular movements grossly intact, moist mucous membranes CV: RRR Back: symmetric, no CVAT PULM: normal respiratory effort, CTABL     Abd: gravid, non-tender, non-distended, soft      Ext: Non-tender, Nonedmeatous   Psych: mood appropriate, speech normal Pelvic deferred  FHT:   140bpm by doppler   Toco: quiet Time: 1hour  A/P: 34 y.o. [redacted]w[redacted]d here for antenatal surveillance during pregnancy.  Principle diagnosis: back pain during pregnancy, resolved.  Labor  Not present  Fetal Wellbeing  Normal FHT by doppler, reassuring for GA  Back/abdominal pain  Pain improved after rest and Tylenol  UA with no evidence of UTI   Renal ultrasound with no evidence of kidney stone  Pain possibly due to kidney stone that she has passed, given RBCs and blood in UA, or muscular pain.   Reviewed management options for muscular pain and return precautions.  D/c home stable, precautions reviewed, follow-up as scheduled.    Genia Del 01/20/2020 4:27 AM  ----- Genia Del, CNM Certified Nurse Midwife [facility] Department of OB/GYN [facility]

## 2020-01-20 NOTE — OB Triage Note (Signed)
Pt arrived to unit with complaints of sharp pain 7/10 in her abdomen and back that awoke her from sleep. Pt notes pain during urination and " thinks she has a UTI"  (per interpreter). Notes fetal movement during the day. Denies LOF or foul discharge. Notes some bloodspotting when wiping after urinating. Continue to assess.

## 2020-02-08 ENCOUNTER — Telehealth: Payer: Self-pay

## 2020-02-08 ENCOUNTER — Ambulatory Visit: Payer: Self-pay

## 2020-02-08 NOTE — Telephone Encounter (Signed)
DNKA this pm as scheduled in [facility]. Call to client to reschedule appt and per client, she forgot appt. Appt rescheduled for 02/11/2020. Jossie Ng, RN

## 2020-02-11 ENCOUNTER — Ambulatory Visit: Payer: Self-pay | Admitting: Family Medicine

## 2020-02-11 ENCOUNTER — Encounter: Payer: Self-pay | Admitting: Family Medicine

## 2020-02-11 ENCOUNTER — Other Ambulatory Visit: Payer: Self-pay

## 2020-02-11 VITALS — BP 96/66 | HR 80 | Temp 98.4°F | Wt 207.4 lb

## 2020-02-11 DIAGNOSIS — Z98891 History of uterine scar from previous surgery: Secondary | ICD-10-CM

## 2020-02-11 DIAGNOSIS — O99212 Obesity complicating pregnancy, second trimester: Secondary | ICD-10-CM

## 2020-02-11 DIAGNOSIS — O09529 Supervision of elderly multigravida, unspecified trimester: Secondary | ICD-10-CM

## 2020-02-11 DIAGNOSIS — O0991 Supervision of high risk pregnancy, unspecified, first trimester: Secondary | ICD-10-CM

## 2020-02-11 DIAGNOSIS — O0992 Supervision of high risk pregnancy, unspecified, second trimester: Secondary | ICD-10-CM

## 2020-02-11 NOTE — Progress Notes (Addendum)
Patient here for MH RV at 24 2/7. CCNC and PHQ9 today. S/S PTL reviewed and literature given. Declined flu vaccine today and states she might want it at next visit. Patient wants to transfer care to [facility], patient sent to clerical to sign new consent and select delivering OB practice.Burt Knack, RN

## 2020-02-11 NOTE — Progress Notes (Signed)
   PRENATAL VISIT NOTE  Subjective:  Sierra Dunlap is a 34 y.o. G2P1001 at [redacted]w[redacted]d being seen today for ongoing prenatal care.  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  Patient reports no complaints.  Contractions: Not present. Vag. Bleeding: None.  Movement: Present. Denies leaking of fluid/ROM.   The following portions of the patient's history were reviewed and updated as appropriate: allergies, current medications, past family history, past medical history, past social history, past surgical history and problem list. Problem list updated.  Objective:   Vitals:   02/11/20 0832  BP: 96/66  Pulse: 80  Temp: 98.4 F (36.9 C)  Weight: 207 lb 6.4 oz (94.1 kg)    Fetal Status:     Movement: Present     General:  Alert, oriented and cooperative. Patient is in no acute distress.  Skin: Skin is warm and dry. No rash noted.   Cardiovascular: Normal heart rate noted  Respiratory: Normal respiratory effort, no problems with respiration noted  Abdomen: Soft, gravid, appropriate for gestational age.  Pain/Pressure: Absent     Pelvic: Cervical exam deferred        Extremities: Normal range of motion.  Edema: None  Mental Status: Normal mood and affect. Normal behavior. Normal judgment and thought content.   Assessment and Plan:  Pregnancy: G2P1001 at [redacted]w[redacted]d   1. Supervision of high risk pregnancy in first trimester -Unable to find anatomy US for this pt. She  had [place] at [facility] at 16 weeks, though per that report full anatomy evaluation precluded by GA. She did not know of another [place] scheduled. As such, I have placed a referral today for anatomy US at [facility].  -Desires to switch to [facility] - pt to fill out paperwork at front today.  -Missed appt for AFP to be drawn 9/7. NIPT normal, [place] w/no abnormalities. -States back pain has resolved.   2. Obesity affecting pregnancy in second trimester 12 lb 6.4 oz (5.625 kg)  3. Previous cesarean section 09/14/13 failed IOL with arrest of dilitation @ 9 cm -Desires TOLAC. Pt requests transfer to [facility] today. Will need delviery plans there at 34 wks.  4. Antepartum multigravida of advanced maternal age    Preterm labor symptoms and general obstetric precautions including but not limited to vaginal bleeding, contractions, leaking of fluid and fetal movement were reviewed in detail with the patient. Please refer to After Visit Summary for other counseling recommendations.  Return in about 4 weeks (around 03/10/2020) for routine prenatal care.  Future Appointments  Date Time Provider Department Center  03/07/2020  3:40 PM [facility] PROVIDER [facility] None    Ann Held, PA-C

## 2020-02-13 ENCOUNTER — Telehealth: Payer: Self-pay

## 2020-02-13 NOTE — Telephone Encounter (Signed)
Prior to scheduling [facility] anatomy US, verified with client via phone call that would need to take $100 to appt and would be billed balance of $100. Per client, that is acceptable. Call to [facility] to schedule appt and first available appt per [facility] is 02/26/20 at 3:45 pm (arrive 3:30 pm). Return call to client with appt and left message to call maternity regarding [place] appt and number to call provided. M. Yemen interpreted for both of above calls to client. Jossie Ng, RN

## 2020-02-15 NOTE — Telephone Encounter (Signed)
Call to client with [facility] Interpreter ID # [id number] and notified of [place] appt. Jossie Ng, RN

## 2020-02-27 ENCOUNTER — Telehealth: Payer: Self-pay | Admitting: Student

## 2020-02-27 NOTE — Telephone Encounter (Signed)
TC from patient to clinic with concerns for right eye twitching and tingling in face since yesterday. Also reports headache 6-7/10. Denies vision changes. Reports baby movements remain strong and consistent. Denies vaginal bleeding. E.S., CNM recommends for patient to go to emergency department to be evaluated. Patient verbalizes understanding. Sharlyne Pacas, RN

## 2020-03-07 ENCOUNTER — Ambulatory Visit: Payer: Self-pay | Admitting: Family Medicine

## 2020-03-07 ENCOUNTER — Other Ambulatory Visit: Payer: Self-pay

## 2020-03-07 ENCOUNTER — Encounter: Payer: Self-pay | Admitting: Family Medicine

## 2020-03-07 ENCOUNTER — Other Ambulatory Visit: Payer: Self-pay | Admitting: Family Medicine

## 2020-03-07 ENCOUNTER — Telehealth: Payer: Self-pay

## 2020-03-07 VITALS — BP 112/70 | HR 77 | Temp 98.2°F | Wt 208.2 lb

## 2020-03-07 DIAGNOSIS — O0993 Supervision of high risk pregnancy, unspecified, third trimester: Secondary | ICD-10-CM

## 2020-03-07 DIAGNOSIS — Z98891 History of uterine scar from previous surgery: Secondary | ICD-10-CM

## 2020-03-07 DIAGNOSIS — O09523 Supervision of elderly multigravida, third trimester: Secondary | ICD-10-CM

## 2020-03-07 DIAGNOSIS — Z23 Encounter for immunization: Secondary | ICD-10-CM

## 2020-03-07 DIAGNOSIS — O09529 Supervision of elderly multigravida, unspecified trimester: Secondary | ICD-10-CM

## 2020-03-07 DIAGNOSIS — Z3483 Encounter for supervision of other normal pregnancy, third trimester: Secondary | ICD-10-CM

## 2020-03-07 DIAGNOSIS — O99212 Obesity complicating pregnancy, second trimester: Secondary | ICD-10-CM

## 2020-03-07 DIAGNOSIS — K219 Gastro-esophageal reflux disease without esophagitis: Secondary | ICD-10-CM | POA: Insufficient documentation

## 2020-03-07 DIAGNOSIS — O99213 Obesity complicating pregnancy, third trimester: Secondary | ICD-10-CM

## 2020-03-07 HISTORY — DX: Gastro-esophageal reflux disease without esophagitis: K21.9

## 2020-03-07 NOTE — Progress Notes (Signed)
PRENATAL VISIT NOTE  Subjective:  Sierra Dunlap is a 34 y.o. G2P1001 at [redacted]w[redacted]d being seen today for ongoing prenatal care.  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  Patient reports heartburn.  Contractions: Not present. Vag. Bleeding: None.  Movement: Present. Denies leaking of fluid/ROM.   The following portions of the patient's history were reviewed and updated as appropriate: allergies, current medications, past family history, past medical history, past social history, past surgical history and problem list. Problem list updated.  Objective:   Vitals:   03/07/20 1602  BP: 112/70  Pulse: 77  Temp: 98.2 F (36.8 C)  Weight: 208 lb 3.2 oz (94.4 kg)    Fetal Status: Fetal Heart Rate (bpm): 155 Fundal Height: 32 cm Movement: Present     General:  Alert, oriented and cooperative. Patient is in no acute distress.  Skin: Skin is warm and dry. No rash noted.   Cardiovascular: Normal heart rate noted  Respiratory: Normal respiratory effort, no problems with respiration noted  Abdomen: Soft, gravid, appropriate for gestational age.  Pain/Pressure: Absent     Pelvic: Cervical exam deferred        Extremities: Normal range of motion.  Edema: None  Mental Status: Normal mood and affect. Normal behavior. Normal judgment and thought content.   Assessment and Plan:  Pregnancy: G2P1001 at [redacted]w[redacted]d    1. Supervision of high risk pregnancy in third  trimester -Too late in day for 28 wk labs - will RTC for RN visit next week for this -She has not yet had anatomy US. Was referred to [facility] but no showed appt d/t didn't have $100. We discussed going to [facility] for [place], but pt is adamant about not switching to [facility] and [facility] Korea are booking 6+ wks out. As such, I have referred to [facility] today for anatomy US and AFI/EFW for size>dates.  -Reviewed prenatal visit schedule q2 wks until 36 wks, then q1 wk.  -Discussed option of doula during delivery. [facility] pt: pamphlet given, advised pt to call for more information.  2. Antepartum multigravida of advanced maternal age -Genetic screening negative. Didn't get detailed anatomy US - referred today to [facility] (see #1)  3. Obesity affecting pregnancy in second trimester 13 lb 3.2 oz (5.987 kg)  4. Previous cesarean section 09/14/13 failed IOL with arrest of dilitation @ 9 cm -Desires TOLAC, will need delivery plans referral next visit  5. Gastroesophageal reflux disease, unspecified whether esophagitis present -Advised eating small meals and avoiding common trigger foods/drinks, elevating head at nighttime, not reclining immediately after eating, and using Tums as needed. If that fails advised trial of cimetidine (Tagamet HB) 200mg  twice per day. Handout with information given as well.     Preterm labor symptoms and general obstetric precautions including but not limited to vaginal bleeding, contractions, leaking of fluid and fetal movement were reviewed in detail with the patient. Please refer to After Visit Summary for other counseling recommendations.  Return in about 3 days (around 03/10/2020) for  28 wk labs, and again in 2 wks for provider visit.  Future Appointments  Date Time Provider Department Center  03/12/2020  8:30 AM [facility] NURSE [facility] None    Ann Held, PA-C

## 2020-03-07 NOTE — Progress Notes (Addendum)
Due to appt time, unable to do 28 week labs today. Client elected to return to Nurse Clinic next week for labs and appt scheduled. Appt reminder card given. [facility] [place] referral for anatomy US and S>D faxed witih confirmation received. Jossie Ng, RN

## 2020-03-07 NOTE — Telephone Encounter (Signed)
[facility] anatomy and S>D [place] scheduled for 03/13/2020 with arrival time of 12:30 pm. Call to client and left message to call regarding [place] appt. Number to call provided. [employer] (ID # [id number]) utilized during call. Jossie Ng, RN

## 2020-03-10 NOTE — Telephone Encounter (Signed)
Phone call to pt with interpreter Marlene Yemen. Pt informed of U/S appt at [facility] on 03/13/20, with arrival time of 12:30. Pt confirmed she will be able to go to appt.

## 2020-03-12 ENCOUNTER — Other Ambulatory Visit: Payer: Self-pay

## 2020-03-12 DIAGNOSIS — O0993 Supervision of high risk pregnancy, unspecified, third trimester: Secondary | ICD-10-CM

## 2020-03-12 DIAGNOSIS — O0991 Supervision of high risk pregnancy, unspecified, first trimester: Secondary | ICD-10-CM

## 2020-03-12 LAB — HEMOGLOBIN, FINGERSTICK: Hemoglobin: 11.8 g/dL (ref 11.1–15.9)

## 2020-03-12 NOTE — Progress Notes (Signed)
Sierra Dunlap interpreter today. Here for 28 wk labs- 1 hr glucola, hgb, HIV, RPR. Completed drink for 1 hr glucola at 8:30am. Instructions given to remain npo, stay on first floor, notify if n/v, and to go to lab at 9:25 am for lab draw at 9:30 am. To return to Nurse Clinic after lab draw to review hgb results. Pt returned to Nurse Clinic after lab draw. Hgb results 11.8 g/dl - WNL. Pt instructed to continue PNV and no additional  treatment indicated. Pt reports understanding. Jerel Shepherd, RN

## 2020-03-13 ENCOUNTER — Telehealth: Payer: Self-pay

## 2020-03-13 ENCOUNTER — Ambulatory Visit: Admission: RE | Admit: 2020-03-13 | Payer: Self-pay | Source: Ambulatory Visit

## 2020-03-13 ENCOUNTER — Encounter: Payer: Self-pay | Admitting: Advanced Practice Midwife

## 2020-03-13 DIAGNOSIS — R7309 Other abnormal glucose: Secondary | ICD-10-CM | POA: Insufficient documentation

## 2020-03-13 HISTORY — DX: Other abnormal glucose: R73.09

## 2020-03-13 LAB — HIV ANTIBODY (ROUTINE TESTING W REFLEX): HIV Screen 4th Generation wRfx: NONREACTIVE

## 2020-03-13 LAB — GLUCOSE, 1 HOUR GESTATIONAL: Gestational Diabetes Screen: 204 mg/dL — ABNORMAL HIGH (ref 65–139)

## 2020-03-13 LAB — RPR: RPR Ser Ql: NONREACTIVE

## 2020-03-13 NOTE — Telephone Encounter (Signed)
TC to patient to inform of elevated 1 hr gtt on 03/12/20 (204). Patient counseled on need to do 3 hour gtt asap and now scheduled for 03/18/2020. Patient counseled on fasting before test and procedure on day of test, questions answered. Interpreter M. Yemen.Burt Knack, RN

## 2020-03-13 NOTE — Progress Notes (Signed)
Consulted on the plan of care for this client.  I agree with the documented note and actions taken to provide care for this client.  E. Clance Baquero, CNM  

## 2020-03-18 ENCOUNTER — Other Ambulatory Visit: Payer: Self-pay

## 2020-03-18 ENCOUNTER — Encounter: Payer: Self-pay | Admitting: Family Medicine

## 2020-03-18 ENCOUNTER — Ambulatory Visit: Payer: Self-pay | Admitting: Family Medicine

## 2020-03-18 VITALS — BP 116/69 | HR 79 | Temp 98.6°F | Wt 207.8 lb

## 2020-03-18 DIAGNOSIS — O0990 Supervision of high risk pregnancy, unspecified, unspecified trimester: Secondary | ICD-10-CM

## 2020-03-18 DIAGNOSIS — O09523 Supervision of elderly multigravida, third trimester: Secondary | ICD-10-CM

## 2020-03-18 DIAGNOSIS — O99213 Obesity complicating pregnancy, third trimester: Secondary | ICD-10-CM

## 2020-03-18 DIAGNOSIS — O0991 Supervision of high risk pregnancy, unspecified, first trimester: Secondary | ICD-10-CM

## 2020-03-18 DIAGNOSIS — Z98891 History of uterine scar from previous surgery: Secondary | ICD-10-CM

## 2020-03-18 MED ORDER — PRENATAL VITAMIN 27-0.8 MG PO TABS: 1.0000 | ORAL_TABLET | Freq: Every day | ORAL | 0 refills | Status: DC

## 2020-03-18 NOTE — Progress Notes (Signed)
Patient here for MH RV at 29 6/7, and 3 hour today. Patient next lab draw 9:40. Patient went to 03/13/20 U/S but had child with her and they wouldn't do the U/S, needs to be rescheduled. Patient sent back to lab for next blood draw and will return to clinic for provider visit.Burt Knack, RN

## 2020-03-18 NOTE — Progress Notes (Signed)
PRENATAL VISIT NOTE  Subjective:  Sierra Dunlap is a 34 y.o. G2P1001 at [redacted]w[redacted]d being seen today for ongoing prenatal care.  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  Patient reports no complaints.  Contractions: Not present. Vag. Bleeding: None.  Movement: Present. Denies leaking of fluid/ROM.   The following portions of the patient's history were reviewed and updated as appropriate: allergies, current medications, past family history, past medical history, past social history, past surgical history and problem list. Problem list updated.  Objective:   Vitals:   03/18/20 0927  BP: 116/69  Pulse: 79  Temp: 98.6 F (37 C)  Weight: 207 lb 12.8 oz (94.3 kg)    Fetal Status: Fetal Heart Rate (bpm): 149 Fundal Height: 31 cm Movement: Present     General:  Alert, oriented and cooperative. Patient is in no acute distress.  Skin: Skin is warm and dry. No rash noted.   Cardiovascular: Normal heart rate noted  Respiratory: Normal respiratory effort, no problems with respiration noted  Abdomen: Soft, gravid, appropriate for gestational age.  Pain/Pressure: Absent     Pelvic: Cervical exam deferred        Extremities: Normal range of motion.  Edema: None  Mental Status: Normal mood and affect. Normal behavior. Normal judgment and thought content.   Assessment and Plan:  Pregnancy: G2P1001 at [redacted]w[redacted]d  1. Supervision of high risk pregnancy   - FH elevated, continued S>D. Discussed need for [place] and patient is unable to pay for [employer] [place]. RN will follow up with [facility] to get patient rescheduled for this exam.  - Glucose Tolerance Test, 6 Hour  2. Obesity affecting pregnancy in third trimester TWG=12 lb 12.8 oz (5.806 kg) which is within goal currently but patient is likely to overgain based on this trajectory.   3. Supervision of high risk pregnancy, antepartum  4. Multigravida of advanced maternal age in third trimester  5. Previous cesarean section 09/14/13 failed IOL with arrest of dilitation @ 9 cm - WIll get delivery plans appt   6. Third-stage postpartum hemorrhage LD aware   Preterm labor symptoms and general obstetric precautions including but not limited to vaginal bleeding, contractions, leaking of fluid and fetal movement were reviewed in detail with the patient. Please refer to After Visit Summary for other counseling recommendations.  Return in about 2 weeks (around 04/01/2020) for Routine prenatal care.  Future Appointments  Date Time Provider Department Center  03/25/2020  1:00 PM [facility] 3 [facility]  04/01/2020  4:00 PM [facility] PROVIDER [facility] None    Federico Flake, MD

## 2020-03-18 NOTE — Progress Notes (Signed)
Patient appointment interrupted by fire drill. Patient given more PNV today, and rescheduled [facility] U/S for 03/25/20 at 1:00. Per [facility] scheduling, patient counseled to go by herself, no partner or children allowed, and to go with full bladder. Patient states understanding.Marland KitchenMarland KitchenBurt Knack, RN

## 2020-03-19 ENCOUNTER — Other Ambulatory Visit: Payer: Self-pay | Admitting: Advanced Practice Midwife

## 2020-03-19 ENCOUNTER — Telehealth: Payer: Self-pay | Admitting: Advanced Practice Midwife

## 2020-03-19 ENCOUNTER — Encounter: Payer: Self-pay | Admitting: Advanced Practice Midwife

## 2020-03-19 DIAGNOSIS — Z8632 Personal history of gestational diabetes: Secondary | ICD-10-CM | POA: Insufficient documentation

## 2020-03-19 DIAGNOSIS — O24419 Gestational diabetes mellitus in pregnancy, unspecified control: Secondary | ICD-10-CM | POA: Insufficient documentation

## 2020-03-19 DIAGNOSIS — R7309 Other abnormal glucose: Secondary | ICD-10-CM

## 2020-03-19 LAB — GLUCOSE TOLERANCE TEST, 6 HOUR
Glucose, 1 Hour GTT: 201 mg/dL — ABNORMAL HIGH (ref 65–199)
Glucose, 2 hour: 176 mg/dL — ABNORMAL HIGH (ref 65–139)
Glucose, 3 hour: 147 mg/dL — ABNORMAL HIGH (ref 65–109)
Glucose, GTT - Fasting: 88 mg/dL (ref 65–99)

## 2020-03-19 MED ORDER — GLUCOSE BLOOD VI STRP: ORAL_STRIP | 12 refills | Status: DC

## 2020-03-19 MED ORDER — ACCU-CHEK SOFTCLIX LANCETS MISC: 12 refills | Status: DC

## 2020-03-19 NOTE — Telephone Encounter (Signed)
T/C via interpreter Marlene Yemen, to discuss new gestational diabetes dx, potential complications to self and fetus, now a high risk pregnancy, need for 2 Lifestyle appts, checking blood sugars 4x/day, weekly appts.  Questions answered; pt states understanding.

## 2020-03-21 ENCOUNTER — Ambulatory Visit: Payer: Self-pay

## 2020-03-21 ENCOUNTER — Encounter: Payer: Self-pay | Admitting: Family Medicine

## 2020-03-24 ENCOUNTER — Other Ambulatory Visit: Payer: Self-pay | Admitting: Family Medicine

## 2020-03-24 ENCOUNTER — Encounter: Payer: Self-pay | Admitting: Family Medicine

## 2020-03-24 DIAGNOSIS — O24410 Gestational diabetes mellitus in pregnancy, diet controlled: Secondary | ICD-10-CM

## 2020-03-25 ENCOUNTER — Other Ambulatory Visit: Payer: Self-pay

## 2020-03-25 ENCOUNTER — Ambulatory Visit
Admission: RE | Admit: 2020-03-25 | Discharge: 2020-03-25 | Disposition: A | Discharge status: Home / Self Care | Payer: Self-pay | Source: Ambulatory Visit | Attending: Family Medicine | Admitting: Family Medicine

## 2020-03-25 DIAGNOSIS — Z3483 Encounter for supervision of other normal pregnancy, third trimester: Secondary | ICD-10-CM | POA: Insufficient documentation

## 2020-04-01 ENCOUNTER — Encounter: Payer: Self-pay | Attending: Family Medicine | Admitting: *Deleted

## 2020-04-01 ENCOUNTER — Encounter: Payer: Self-pay | Admitting: *Deleted

## 2020-04-01 ENCOUNTER — Ambulatory Visit: Payer: Self-pay | Admitting: Family Medicine

## 2020-04-01 ENCOUNTER — Other Ambulatory Visit: Payer: Self-pay

## 2020-04-01 VITALS — BP 120/70 | Ht 62.0 in | Wt 211.0 lb

## 2020-04-01 DIAGNOSIS — O0993 Supervision of high risk pregnancy, unspecified, third trimester: Secondary | ICD-10-CM

## 2020-04-01 DIAGNOSIS — O24410 Gestational diabetes mellitus in pregnancy, diet controlled: Secondary | ICD-10-CM

## 2020-04-01 DIAGNOSIS — O0990 Supervision of high risk pregnancy, unspecified, unspecified trimester: Secondary | ICD-10-CM

## 2020-04-01 LAB — URINALYSIS
Bilirubin, UA: NEGATIVE
Glucose, UA: NEGATIVE
Ketones, UA: NEGATIVE
Leukocytes,UA: NEGATIVE
Nitrite, UA: NEGATIVE
Protein,UA: NEGATIVE
RBC, UA: NEGATIVE
Specific Gravity, UA: 1.02 (ref 1.005–1.030)
Urobilinogen, Ur: 0.2 mg/dL (ref 0.2–1.0)
pH, UA: 7 (ref 5.0–7.5)

## 2020-04-01 NOTE — Progress Notes (Signed)
Patient here for MH RV at 31 6/7. Urine dip today. Just started taking BS and has glucometer and a few lancets and strips. Needs prescription for more lancets and strips today. Pharmacy is [facility] on [address] Kick counts reviewed and cards given.Burt Knack, RN

## 2020-04-01 NOTE — Progress Notes (Signed)
PRENATAL VISIT NOTE  Subjective:  Sierra Dunlap is a 34 y.o. G2P1001 at [redacted]w[redacted]d being seen today for ongoing prenatal care.  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  Patient reports no complaints.  Contractions: Not present. Vag. Bleeding: None.  Movement: Present. Denies leaking of fluid/ROM.   The following portions of the patient's history were reviewed and updated as appropriate: allergies, current medications, past family history, past medical history, past social history, past surgical history and problem list. Problem list updated.  Objective:  There were no vitals filed for this visit.  Fetal Status: Fetal Heart Rate (bpm): 161 Fundal Height: 34 cm Movement: Present     General:  Alert, oriented and cooperative. Patient is in no acute distress.  Skin: Skin is warm and dry. No rash noted.   Cardiovascular: Normal heart rate noted  Respiratory: Normal respiratory effort, no problems with respiration noted  Abdomen: Soft, gravid, appropriate for gestational age.  Pain/Pressure: Absent     Pelvic: Cervical exam deferred        Extremities: Normal range of motion.  Edema: None  Mental Status: Normal mood and affect. Normal behavior. Normal judgment and thought content.   Assessment and Plan:  Pregnancy: G2P1001 at [redacted]w[redacted]d  1. Supervision of high risk pregnancy, antepartum   Discussed with  patient, importance of life style visits and control of GDM.  Need for U/S at 36 weeks and delivery at 40 weeks.     2. Gestational diabetes mellitus, class A1   Patient had first visit to lifestyles today.  Blood sugar log only has one value- 2hr  pp after lunch BG was 121.   Dicussed with patient the importance of keeping BG log and bringing to visits.    Encouraged continued daily exercise and diet modifications.   72 hr Recent food: Breakfast = 1  Tortilla, beans & cheese             Lunch = 1 tortilla, tomato & onions, cheese           Dinner =1 tortilla, beans & tomatos           Snack = 1/2 banana @11 :30 and pear @ 3:30 pm.   Water: 3-4 glasses of water and juice.    Patient informed that lifestyles discussed with her about her decreasing her juice intake, and increasing water.  Praised patient on acknowledgement of need to make changes.   Encouraged patient to increase protein, protein list given.    - Urinalysis  Completed today.  U/A results was  WNL  -Ultrasound completed on 03/25/2020, next U/S is @36  weeks      Preterm labor symptoms and general obstetric precautions including but not limited to vaginal bleeding, contractions, leaking of fluid and fetal movement were reviewed in detail with the patient. Please refer to After Visit Summary for other counseling recommendations.  Return in about 1 week (around 04/08/2020) for glucose follow up, routine prenatal care.  Future Appointments  Date Time Provider Department Center  04/09/2020  3:20 PM [facility] PROVIDER [facility] None   Spanish interpreter used Marlene Yemen  Wendi Snipes, FNP

## 2020-04-01 NOTE — Progress Notes (Signed)
Diabetes Self-Management Education  Visit Type: First/Initial  Appt. Start Time: 1315 Appt. End Time: 1445  04/01/2020  Ms. Sierra Dunlap, identified by name and date of birth, is a 34 y.o. female with a diagnosis of Diabetes: Gestational Diabetes.   ASSESSMENT  Blood pressure 120/70, height 5\' 2"  (1.575 m), weight 211 lb (95.7 kg), last menstrual period 08/22/2019, estimated date of delivery 05/28/2020 Body mass index is 38.59 kg/m.   Diabetes Self-Management Education - 04/01/20 1520      Visit Information   Visit Type First/Initial      Initial Visit   Diabetes Type Gestational Diabetes    Are you currently following a meal plan? Yes    What type of meal plan do you follow? "try to avoid sugar and bread"    Are you taking your medications as prescribed? Yes    Date Diagnosed 1 month ago      Health Coping   How would you rate your overall health? Good      Psychosocial Assessment   Patient Belief/Attitude about Diabetes Motivated to manage diabetes   "I don't feel any symptoms"   Self-care barriers None    Self-management support Doctor's office;Family    Other persons present Interpreter   04/03/20   Patient Concerns Nutrition/Meal planning;Healthy Lifestyle    Special Needs Other (comment)   Spanish materials   Preferred Learning Style Auditory    Learning Readiness Change in progress    How often do you need to have someone help you when you read instructions, pamphlets, or other written materials from your doctor or pharmacy? 1 - Never   if written in Spanish   What is the last grade level you completed in school? 9th      Pre-Education Assessment   Patient understands the diabetes disease and treatment process. Needs Review    Patient understands incorporating nutritional management into lifestyle. Needs Instruction    Patient undertands incorporating physical activity into lifestyle. Needs Instruction    Patient understands using medications safely. Needs  Instruction    Patient understands monitoring blood glucose, interpreting and using results Needs Instruction    Patient understands prevention, detection, and treatment of acute complications. Needs Instruction    Patient understands prevention, detection, and treatment of chronic complications. Needs Instruction    Patient understands how to develop strategies to address psychosocial issues. Needs Instruction    Patient understands how to develop strategies to promote health/change behavior. Needs Instruction      Complications   Last HgB A1C per patient/outside source 5.8 %   11/13/2019   How often do you check your blood sugar? 0 times/day (not testing)   Provided Accu-Chek Guide Me meter and instructed on use. BG upon return demonstration was 121 mg/dL at 01/14/2020 pm - 2 hrs pp.   Have you had a dilated eye exam in the past 12 months? No    Have you had a dental exam in the past 12 months? No    Are you checking your feet? No      Dietary Intake   Breakfast tortilla (bread that looks like pita and made from corn); beans    Lunch rice, tortilla, green beans, fruit (banana,apple)    Snack (afternoon) yogurt, apple    Dinner beef, cheese, some chicken, tortilla, beans, tomato, onions, broccoli, cauliflower, cuccumbers, carrots, zucchini, squash    Beverage(s) water, juice      Exercise   Exercise Type ADL's      Patient  Education   Previous Diabetes Education Yes (please comment)   Prior GDM 6 years ago - education at [facility]   Disease state  Definition of diabetes, type 1 and 2, and the diagnosis of diabetes;Factors that contribute to the development of diabetes    Nutrition management  Role of diet in the treatment of diabetes and the relationship between the three main macronutrients and blood glucose level;Food label reading, portion sizes and measuring food.;Reviewed blood glucose goals for pre and post meals and how to evaluate the patients' food intake on their blood glucose  level.    Physical activity and exercise  Role of exercise on diabetes management, blood pressure control and cardiac health.    Medications Other (comment)   Limited use of oral medications during pregnancy and potential for insulin.   Monitoring Taught/evaluated SMBG meter.;Purpose and frequency of SMBG.;Taught/discussed recording of test results and interpretation of SMBG.;Identified appropriate SMBG and/or A1C goals.;Ketone testing, when, how.    Chronic complications Relationship between chronic complications and blood glucose control    Psychosocial adjustment Identified and addressed patients feelings and concerns about diabetes    Preconception care Pregnancy and GDM  Role of pre-pregnancy blood glucose control on the development of the fetus;Reviewed with patient blood glucose goals with pregnancy;Role of family planning for patients with diabetes      Individualized Goals (developed by patient)   Reducing Risk Other (comment)   Lead a healthy lifestyle     Outcomes   Expected Outcomes Demonstrated interest in learning. Expect positive outcomes    Program Status Not Completed           Individualized Plan for Diabetes Self-Management Training:   Learning Objective:  Patient will have a greater understanding of diabetes self-management. Patient education plan is to attend individual and/or group sessions per assessed needs and concerns.   Plan:   Patient Instructions  Read booklet on Gestational Diabetes Follow Gestational Meal Planning Guidelines Avoid fruit juice Check blood sugars 4 x day - before breakfast and 2 hrs after every meal and record  Bring blood sugar log to all MD appointments Call MD for prescription for meter strips and lancets Strips Accu-Chek Guide Lancets   Accu-Chek Softclix Purchase urine ketone strips if instructed by MD and check urine ketones every am:  If + increase bedtime snack to 1 protein and 2 carbohydrate servings Walk 20-30 minutes at least  5 x week if permitted by MD  Expected Outcomes:  Demonstrated interest in learning. Expect positive outcomes  Education material provided:  Gestational Booklet (Spanish) Gestational Meal Planning Guidelines (Spanish) Simple Meal Plan (Spanish) Meter = Accu-Chek Guide Me Goals for a Healthy Pregnancy (Spanish)  If problems or questions, patient to contact team via:  Sharion Settler, RN, CCM, CDCES [phone]  Future DSME appointment:  The patient doesn't have insurance and doesn't want to return for a follow up with the dietitian.

## 2020-04-01 NOTE — Progress Notes (Signed)
TC to [facility] to schedule dietician appointment. Patient had 1st appointment today and received glucometer. LM for [facility] with number to call. Urine dip reviewed by provider, no new orders.Burt Knack, RN

## 2020-04-01 NOTE — Patient Instructions (Signed)
Read booklet on Gestational Diabetes Follow Gestational Meal Planning Guidelines Avoid fruit juice Check blood sugars 4 x day - before breakfast and 2 hrs after every meal and record  Bring blood sugar log to all MD appointments Call MD for prescription for meter strips and lancets Strips Accu-Chek Guide Lancets   Accu-Chek Softclix Purchase urine ketone strips if instructed by MD and check urine ketones every am:  If + increase bedtime snack to 1 protein and 2 carbohydrate servings Walk 20-30 minutes at least 5 x week if permitted by MD

## 2020-04-02 NOTE — Progress Notes (Signed)
04/02/20 Per Inetta Fermo at [facility], patient declined 2nd appointment due to cost. Patient received meal planning guidelines at first appointment, and letter has been sent to provider explaining what was covered at that appointment, per Inetta Fermo.Burt Knack, RN

## 2020-04-09 ENCOUNTER — Other Ambulatory Visit: Payer: Self-pay

## 2020-04-09 ENCOUNTER — Ambulatory Visit: Payer: Self-pay | Admitting: Advanced Practice Midwife

## 2020-04-09 DIAGNOSIS — O99213 Obesity complicating pregnancy, third trimester: Secondary | ICD-10-CM

## 2020-04-09 DIAGNOSIS — O0990 Supervision of high risk pregnancy, unspecified, unspecified trimester: Secondary | ICD-10-CM

## 2020-04-09 DIAGNOSIS — O09523 Supervision of elderly multigravida, third trimester: Secondary | ICD-10-CM

## 2020-04-09 DIAGNOSIS — O24410 Gestational diabetes mellitus in pregnancy, diet controlled: Secondary | ICD-10-CM

## 2020-04-09 DIAGNOSIS — Z98891 History of uterine scar from previous surgery: Secondary | ICD-10-CM

## 2020-04-09 LAB — URINALYSIS
Bilirubin, UA: NEGATIVE
Glucose, UA: NEGATIVE
Nitrite, UA: NEGATIVE
Protein,UA: NEGATIVE
RBC, UA: NEGATIVE
Specific Gravity, UA: 1.02 (ref 1.005–1.030)
Urobilinogen, Ur: 0.2 mg/dL (ref 0.2–1.0)
pH, UA: 7 (ref 5.0–7.5)

## 2020-04-09 MED ORDER — METFORMIN HCL 500 MG PO TABS: 500.0000 mg | ORAL_TABLET | Freq: Two times a day (BID) | ORAL | Status: AC

## 2020-04-09 MED ORDER — METFORMIN HCL 500 MG PO TABS: 1000.0000 mg | ORAL_TABLET | Freq: Two times a day (BID) | ORAL | Status: DC

## 2020-04-09 NOTE — Progress Notes (Signed)
PRENATAL VISIT NOTE  Subjective:  Sierra Dunlap is a 34 y.o. G2P1001 at [redacted]w[redacted]d being seen today for ongoing prenatal care.  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  Patient reports no complaints.  Contractions: Not present. Vag. Bleeding: None.  Movement: Present. Denies leaking of fluid/ROM.   The following portions of the patient's history were reviewed and updated as appropriate: allergies, current medications, past family history, past medical history, past social history, past surgical history and problem list. Problem list updated.  Objective:  There were no vitals filed for this visit.  Fetal Status: Fetal Heart Rate (bpm): 130 Fundal Height: 36 cm Movement: Present     General:  Alert, oriented and cooperative. Patient is in no acute distress.  Skin: Skin is warm and dry. No rash noted.   Cardiovascular: Normal heart rate noted  Respiratory: Normal respiratory effort, no problems with respiration noted  Abdomen: Soft, gravid, appropriate for gestational age.  Pain/Pressure: Absent     Pelvic: Cervical exam deferred        Extremities: Normal range of motion.  Edema: None  Mental Status: Normal mood and affect. Normal behavior. Normal judgment and thought content.   Assessment and Plan:  Pregnancy: G2P1001 at [redacted]w[redacted]d  1. Diet controlled gestational diabetes mellitus (GDM),  antepartum Blood sugar log values below. Encouraged continued daily exercise and diet modifications. 8 of  8 FBS values are abnormal (range was 98 to 120) 13of  24 2hr pp values are abnormal (range was 94 to 133) Exercise: dances in her home 15. minutes per day 5x/wk Diet recall :   Breakfast = 1/2 tortilla with beans, water                      Lunch = chicken with potatoes, rice, lettuce, cucumber with lemon, water                      Dinner = chicken, rice, cucumber, water                      Snack = 1/2 banana, 1 apple Water: drinks about  4 bottles of water/day and night Pt went to first [facility] appt on 04/01/20 and states second appt is maybe next week? Consulted with Dr. Alvester Morin and plan is to transfer pt to [facility], begin NST's 2x/wk, begin Metformin 500 mg BID x 4-5 days today and then increase to 1,000 mg BID for remainder of pregnancy. NST scheduled here tomorrow and 04/14/20 and 04/18/20 with RV on 04/14/20.  - Urinalysis (Urine Dip)=1+ ketones  2. Previous cesarean section 09/14/13 failed IOL with arrest of dilitation @ 9 cm Delivery plans referral written to [facility] as pt desires TOLAC and desires [facility]  3. Obesity affecting pregnancy in third trimester 12 lb 12.8 oz (5.806 kg) Taking ASA 81 mg daily  4. Hx GDM class A1--with 2015 pregnancy with transfer to [facility] at 35 5/7 for  uncontrolled GD   5. Supervision of high risk pregnancy, antepartum Last u/s 03/25/20 with EFW=49%, AFI wnl, anatomy wnl 36 wk u/s scheduled at [facility] per pt request (pt informed will need to take $60 with her to that apt) Extensive discussion/counseling done with pt re: uncontrolled diabetes and potential implications and risk to self and fetus in AP, IP, PP. Also discussed implications of gestational diabetes during her prenatal care (appts 1x/wk, NST's 2x/wk, Metformin BID with possibility of insulin management, checking blood sugars 4x/day and maintaining diabetic diet, possibility of early delivery), and that she  will need to be transferred for care elsewhere, as she is too high risk for our facility.  Also discussed financial responsibilities of receiving care at [facility].  Pt insists on receiving care and delivering at [facility] but states she will talk to her partner tonight.  Provider again made clear that because pt does not have Medicaid or insurance, [facility] will insist on payment at appts.  Pt states she understands, but still wants [facility] care.  Pt agrees to return tomorrow for NST and further discussion about her decision of where she wants transfer of prenatal care ([facility] or [facility])  6. Multigravida of advanced maternal age in third trimester 34 yo   Preterm labor symptoms and general obstetric precautions including but not limited to vaginal bleeding, contractions, leaking of fluid and fetal movement were reviewed in detail with the patient. Please refer to After Visit Summary for other counseling recommendations.  Return in about 8 days (around 04/17/2020).  Future Appointments  Date Time Provider Department Center  04/10/2020  8:40 AM [facility] PROVIDER [facility] None  04/14/2020  8:20 AM [facility] PROVIDER [facility] None  04/18/2020  9:00 AM [facility] PROVIDER AC-[facility] None    Alberteen Spindle, CNM           Alberteen Spindle, CNMDiscussed case with CNM Arnetha Courser.   100% of fastings and nearly 50% of pp are above goal.   Recommendations: 1) Start Metformin 500mg  BID and increase to 1000mg  in 4-5 days 2) Recommend delivery at [facility] given GDM and desire for TOLAC.  3) Start 2x weekly NST at [facility] given starting medication 4) Recommend transfer of care to [facility] for remainder of pregnancy   [facility], MD, MPH, ABFM Medical Director  [facility] Department

## 2020-04-09 NOTE — Progress Notes (Signed)
Presents for MH RV at 33.0 weeks. Take PNV and aspirin daily. Denies Ed/Hospital visit since last RV. Sharlyne Pacas, RN

## 2020-04-10 ENCOUNTER — Encounter: Payer: Self-pay | Admitting: Family Medicine

## 2020-04-10 ENCOUNTER — Encounter: Payer: Self-pay | Admitting: Physician Assistant

## 2020-04-10 ENCOUNTER — Ambulatory Visit: Payer: Self-pay | Admitting: Physician Assistant

## 2020-04-10 VITALS — BP 112/71 | HR 77 | Temp 98.0°F | Wt 206.6 lb

## 2020-04-10 DIAGNOSIS — O24415 Gestational diabetes mellitus in pregnancy, controlled by oral hypoglycemic drugs: Secondary | ICD-10-CM

## 2020-04-10 DIAGNOSIS — O0990 Supervision of high risk pregnancy, unspecified, unspecified trimester: Secondary | ICD-10-CM

## 2020-04-10 LAB — FETAL NONSTRESS TEST

## 2020-04-10 NOTE — Progress Notes (Signed)
Patient here for NST, states she ate breakfast this a.m.Marland Kitchen Patient also states she has decided to go to [facility] instead of [facility]. Provider reviewed NST and states patient is ok to be done. Provider to complete new referral form for [facility] and patient aware of NST and MH RV on 04/14/20. Patient states she started her Metformin last night, and is aware she needs 2 doses each day starting today, and then will need to pick up the Metformin 1000 mg and start on Sunday, 04/13/20 and take that dose 2x/day with food. Patient states questions answered and no more questions at this time.Burt Knack, RN

## 2020-04-10 NOTE — Progress Notes (Signed)
   PRENATAL VISIT NOTE  Subjective:  Sierra Dunlap is a 34 y.o. G2P1001 at [redacted]w[redacted]d being seen today for ongoing prenatal care for NST only today.  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 ; Hx GDM class A1--with 2015 pregnancy with transfer to [facility] at 35 5/7 for uncontrolled GD; Obesity affecting pregnancy BMI=37.5; Previous cesarean section 09/14/13 failed IOL with arrest of dilitation @ 9 cm; Hx pp hemorrhage with EBL: 1000; Advanced maternal age in multigravida 34 yo; Supervision of high risk pregnancy, antepartum; Condyloma external ; Back pain complicating pregnancy; Gastroesophageal reflux disease; Elevated glucose level; and Gestational diabetes mellitus (GDM), 03/18/20 with Metfomin 500 mg BID initiated 04/09/20 x 4 days & then 1,000 mg BID for remainder of pregnancy on their problem list.  Patient reports no complaints, good FM.   .  .   . Denies leaking of fluid/ROM.   The following portions of the patient's history were reviewed and updated as appropriate: allergies, current medications, past family history, past medical history, past social history, past surgical history and problem list. Problem list updated.  Objective:   Vitals:   04/10/20 0914  BP: 112/71  Pulse: 77  Temp: 98 F (36.7 C)  Weight: 206 lb 9.6 oz (93.7 kg)    Fetal Status:         NST reassuring, though difficult to get sustained tracing due to fetal movement.  General:  Alert, oriented and cooperative. Patient is in no acute distress.  Skin: Skin is warm and dry. No rash noted.   Cardiovascular: Normal heart rate noted  Respiratory: Normal respiratory effort, no problems with respiration noted  Abdomen: Soft, gravid, appropriate for gestational age.        Pelvic: Cervical exam deferred        Extremities: Normal range of motion.     Mental Status: Normal mood and affect. Normal behavior. Normal judgment and thought content.    Assessment and Plan:  Pregnancy: G2P1001 at [redacted]w[redacted]d  1. Supervision of high risk pregnancy, antepartum Discussion with pt re: recommendation of transfer prenatal care to [facility] for comprehensive management in light of high risk pregnancy. Pt in agreement.   2. Gestational diabetes mellitus (GDM) controlled on oral hypoglycemic drug, antepartum Pt started metformin rx yesterday - to continue as prescribed. Continue home BS monitoring and diabetic diet. NST reassuring today, return 4 days to repeat NST for ongoing antenatal fetal monitoring while awaiting [facility] appt.   Preterm labor symptoms and general obstetric precautions including but not limited to vaginal bleeding, contractions, leaking of fluid and fetal movement were reviewed in detail with the patient. Please refer to After Visit Summary for other counseling recommendations.  Return in about 6 days (around 04/16/2020) for NST.  Future Appointments  Date Time Provider Department Center  04/14/2020  8:20 AM [facility] PROVIDER [facility] None  04/18/2020  9:00 AM [facility] PROVIDER AC-[facility] None    Landry Dyke, PA-C

## 2020-04-11 ENCOUNTER — Other Ambulatory Visit: Payer: Self-pay

## 2020-04-14 ENCOUNTER — Ambulatory Visit: Payer: Self-pay | Admitting: Advanced Practice Midwife

## 2020-04-14 ENCOUNTER — Other Ambulatory Visit: Payer: Self-pay

## 2020-04-14 VITALS — BP 119/67 | HR 81 | Temp 98.8°F | Wt 207.2 lb

## 2020-04-14 DIAGNOSIS — Z98891 History of uterine scar from previous surgery: Secondary | ICD-10-CM

## 2020-04-14 DIAGNOSIS — O99213 Obesity complicating pregnancy, third trimester: Secondary | ICD-10-CM

## 2020-04-14 DIAGNOSIS — O09523 Supervision of elderly multigravida, third trimester: Secondary | ICD-10-CM

## 2020-04-14 DIAGNOSIS — O24410 Gestational diabetes mellitus in pregnancy, diet controlled: Secondary | ICD-10-CM

## 2020-04-14 DIAGNOSIS — O24415 Gestational diabetes mellitus in pregnancy, controlled by oral hypoglycemic drugs: Secondary | ICD-10-CM

## 2020-04-14 DIAGNOSIS — O0990 Supervision of high risk pregnancy, unspecified, unspecified trimester: Secondary | ICD-10-CM

## 2020-04-14 LAB — URINALYSIS
Bilirubin, UA: NEGATIVE
Glucose, UA: NEGATIVE
Nitrite, UA: NEGATIVE
RBC, UA: NEGATIVE
Specific Gravity, UA: 1.03 (ref 1.005–1.030)
Urobilinogen, Ur: 0.2 mg/dL (ref 0.2–1.0)
pH, UA: 6.5 (ref 5.0–7.5)

## 2020-04-14 LAB — FETAL NONSTRESS TEST

## 2020-04-14 MED ORDER — METFORMIN HCL 500 MG PO TABS: 500.0000 mg | ORAL_TABLET | Freq: Two times a day (BID) | ORAL | 0 refills | Status: DC

## 2020-04-14 MED ORDER — METFORMIN HCL 500 MG PO TABS: 500.0000 mg | ORAL_TABLET | Freq: Two times a day (BID) | ORAL | Status: DC

## 2020-04-14 NOTE — Progress Notes (Signed)
Scheduled for NST today with RV appt and has scheduled NST appt 04/14/2020 at 9 am. Reminded of appt and reminder card given. Per client, taking Metformin am and pm. Jossie Ng, RN  Per Hazle Coca CNM, handwritten prescription for Metformin 500 mg BID (#16, no refills) handed to client and medication list updated (client has not been taking 1000mg  BID per client). , RN  Urine dip reviewed by Jossie Ng CNM. Hazle Coca, RN   Following review of NST strip per E. Sciora CNM, NST monitoring discontinued after 24 minutes. Fetal heart beat located without difficulty. In order to have a strip, direct pressure had to be held to transducer during entire time of monitoring. Jossie Ng, RN

## 2020-04-14 NOTE — Progress Notes (Signed)
PRENATAL VISIT NOTE  Subjective:  Sierra Dunlap is a 34 y.o. G2P1001 at [redacted]w[redacted]d being seen today for ongoing prenatal care.  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 ; Hx GDM class A1--with 2015 pregnancy with transfer to [facility] at 35 5/7 for uncontrolled GD; Obesity affecting pregnancy BMI=37.5; Previous cesarean section 09/14/13 failed IOL with arrest of dilitation @ 9 cm; Hx pp hemorrhage with EBL: 1000; Advanced maternal age in multigravida 34 yo; Supervision of high risk pregnancy, antepartum; Condyloma external ; Back pain complicating pregnancy; Gastroesophageal reflux disease; and Gestational diabetes mellitus (GDM), 03/18/20 with Metfomin 500 mg BID initiated 04/09/20 x 4 days & then 1,000 mg BID for remainder of pregnancy on their problem list.  Patient reports no complaints.  Contractions: Not present. Vag. Bleeding: None.  Movement: Present. Denies leaking of fluid/ROM.   The following portions of the patient's history were reviewed and updated as appropriate: allergies, current medications, past family history, past medical history, past social history, past surgical history and problem list. Problem list updated.  Objective:   Vitals:   04/14/20 0842  BP: 119/67  Pulse: 81  Temp: 98.8 F (37.1 C)  Weight: 207 lb 3.2 oz (94 kg)    Fetal Status: Fetal Heart Rate (bpm): 160 Fundal Height: 37 cm Movement: Present     General:  Alert, oriented and cooperative. Patient is in no acute distress.  Skin: Skin is warm and dry. No rash noted.   Cardiovascular: Normal heart rate noted  Respiratory: Normal respiratory effort, no problems with respiration noted  Abdomen: Soft, gravid, appropriate for gestational age.  Pain/Pressure: Absent     Pelvic: Cervical exam deferred        Extremities: Normal range of motion.  Edema: None  Mental Status: Normal mood and affect. Normal behavior. Normal judgment and  thought content.   Assessment and Plan:  Pregnancy: G2P1001 at [redacted]w[redacted]d  1. Gestational diabetes mellitus (GDM) in third trimester controlled on oral hypoglycemic drug Blood sugar log values below. Encouraged continued daily exercise and diet modifications. 2 of  5 FBS values are abnormal (range was 80 to 100) 3 of  12 2hr pp values are abnormal (range was 104 to 120) Exercise: dances in her house 5.days per week x 15 min. Diet recall :   Breakfast = beans, 1 egg, 1 tortilla, water                      Lunch = chicken with tomato sauce, rice, lettuce, cucumbers, lemon, water                      Dinner = 1 corn tamale, water                      Snack = 1/2 banana Water: drinks about  4 bottles of water/day and night  Began taking Metformin 500 mg  BID  On 04/09/20 and states took last dose last night.  Hasn't picked up rx for Metformin 1,000 mg  BID at pharmacy yet but states she is "afraid and I don't want to take the higher dose because I felt bad the first day I started the medicine".  Because >50% blood sugars are wnl, told pt I will rx 8 more days of Metformin 500 mg BID since she is now tolerating it and feels "good" and we will reevaluate  on 04/21/20 and increase if >50% blood sugars are abnormal.  Pt agrees to plan and compliance. Awaiting [facility] appt for pt to transfer care there for remainder of pregnancy (referral was faxed last week).  Aware of NST 04/18/20 and today  S>D .  Last u/s 03/25/20 with EFW=49%, AFI wnl, anatomy wnl.  36 wk u/s scheduled at [facility] per pt request; to be done at [facility] when pt transfers care  - Urinalysis (Urine Dip)  2. Obesity affecting pregnancy in third trimester 12 lb 3.2 oz (5.534 kg) Taking ASA 81 mg daily  3. Supervision of high risk pregnancy, antepartum   4. Gestational diabetes mellitus (GDM) controlled on oral hypoglycemic drug, antepartum   5. Multigravida of advanced maternal age in third trimester   6. Previous cesarean section 09/14/13 failed  IOL with arrest of dilitation @ 9 cm [facility] to do delivery plans as soon as  pt transfers to them for remainder of pregnancy; wants TOLAC 7. Hx GDM class A1--with 2015 pregnancy with transfer to [facility] at 35 5/7 for uncontrolled GD History of   Preterm labor symptoms and general obstetric precautions including but not limited to vaginal bleeding, contractions, leaking of fluid and fetal movement were reviewed in detail with the patient. Please refer to After Visit Summary for other counseling recommendations.  Return in about 4 days (around 04/18/2020) for NST, and then RV +NST 04/21/20 if no [facility] appt before then.  Future Appointments  Date Time Provider Department Center  04/18/2020  9:00 AM [facility] PROVIDER AC-MAT None    Alberteen Spindle, CNM

## 2020-04-15 LAB — FETAL NONSTRESS TEST

## 2020-04-15 NOTE — Progress Notes (Signed)
Per [facility], client has transfer of care appt 04/25/2020 at 1430 with Hilbert Odor CNM and 04/25/20 1515 Diabetes Education appt. Both appts are scheduled at [facility]. Jossie Ng, RN

## 2020-04-18 ENCOUNTER — Ambulatory Visit: Payer: Self-pay | Admitting: Family Medicine

## 2020-04-18 ENCOUNTER — Encounter: Payer: Self-pay | Admitting: Family Medicine

## 2020-04-18 ENCOUNTER — Other Ambulatory Visit: Payer: Self-pay

## 2020-04-18 VITALS — BP 106/65 | HR 80 | Temp 98.0°F | Wt 209.8 lb

## 2020-04-18 DIAGNOSIS — O26843 Uterine size-date discrepancy, third trimester: Secondary | ICD-10-CM | POA: Insufficient documentation

## 2020-04-18 DIAGNOSIS — O0990 Supervision of high risk pregnancy, unspecified, unspecified trimester: Secondary | ICD-10-CM

## 2020-04-18 DIAGNOSIS — O99213 Obesity complicating pregnancy, third trimester: Secondary | ICD-10-CM

## 2020-04-18 DIAGNOSIS — Z98891 History of uterine scar from previous surgery: Secondary | ICD-10-CM

## 2020-04-18 DIAGNOSIS — O24415 Gestational diabetes mellitus in pregnancy, controlled by oral hypoglycemic drugs: Secondary | ICD-10-CM

## 2020-04-18 HISTORY — DX: Uterine size-date discrepancy, third trimester: O26.843

## 2020-04-18 LAB — URINALYSIS
Bilirubin, UA: NEGATIVE
Glucose, UA: NEGATIVE
Nitrite, UA: NEGATIVE
RBC, UA: NEGATIVE
Specific Gravity, UA: 1.025 (ref 1.005–1.030)
Urobilinogen, Ur: 1 mg/dL (ref 0.2–1.0)
pH, UA: 7 (ref 5.0–7.5)

## 2020-04-18 LAB — FETAL NONSTRESS TEST

## 2020-04-18 MED ORDER — METFORMIN HCL 500 MG PO TABS: 500.0000 mg | ORAL_TABLET | Freq: Two times a day (BID) | ORAL | 0 refills | Status: DC

## 2020-04-18 NOTE — Progress Notes (Signed)
Per client, retrieved prescription for Metformin received in clinic this week and taking one tablet BID. Aware of [facility] transfer of care and diabetes education appt next week on 04/25/2020. Jossie Ng, RN  Urine dip reviewed by Maximiano Coss PA-C. NST monitoring inititated ~1008 and V. Olmedo, [facility] interpreter utilized during this time. NST appt scheduled for 04/22/2020 and reminder card given. Jossie Ng, RN NST monitoring discontinued per Maximiano Coss PA-C as strip reactive. Jossie Ng, RN

## 2020-04-18 NOTE — Progress Notes (Signed)
PRENATAL VISIT NOTE  Subjective:  Sierra Dunlap is a 34 y.o. G2P1001 at [redacted]w[redacted]d being seen today for ongoing prenatal care.  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 ; Hx GDM class A1--with 2015 pregnancy with transfer to [facility] at 35 5/7 for uncontrolled GD; Obesity affecting pregnancy BMI=37.5; Previous cesarean section 09/14/13 failed IOL with arrest of dilitation @ 9 cm; Hx pp hemorrhage with EBL: 1000; Advanced maternal age in multigravida 34 yo; Supervision of high risk pregnancy, antepartum; Condyloma external ; Back pain complicating pregnancy; Gastroesophageal reflux disease; Gestational diabetes mellitus (GDM), 03/18/20 with Metfomin 500 mg BID initiated 04/09/20 x 4 days & then 1,000 mg BID for remainder of pregnancy; and Uterine size-date discrepancy (S>D) on their problem list.  Patient reports no complaints.  Contractions: Not present. Vag. Bleeding: None.  Movement: Present. Denies leaking of fluid/ROM.   The following portions of the patient's history were reviewed and updated as appropriate: allergies, current medications, past family history, past medical history, past social history, past surgical history and problem list. Problem list updated.  Objective:   Vitals:   04/18/20 0913  BP: 106/65  Pulse: 80  Temp: 98 F (36.7 C)  Weight: 209 lb 12.8 oz (95.2 kg)    Fetal Status: Fetal Heart Rate (bpm): 150 Fundal Height: 37 cm Movement: Present     General:  Alert, oriented and cooperative. Patient is in no acute distress.  Skin: Skin is warm and dry. No rash noted.   Cardiovascular: Normal heart rate noted  Respiratory: Normal respiratory effort, no problems with respiration noted  Abdomen: Soft, gravid, appropriate for gestational age.  Pain/Pressure: Absent     Pelvic: Cervical exam deferred        Extremities: Normal range of motion.  Edema: None  Mental Status: Normal mood and affect.  Normal behavior. Normal judgment and thought content.   Assessment and Plan:  Pregnancy: G2P1001 at [redacted]w[redacted]d   1. Supervision of high risk pregnancy, antepartum -Up to date. Pt has initial OB appt at [facility] next Friday on 12/17 to transfer care d/t previously uncontrolled GDM  2. Gestational diabetes mellitus (GDM) in third trimester controlled on oral hypoglycemic drug -Blood sugar values much improved on metformin 500mg  BID. Evening postprandial values are borderline/high - pt endorses lots of beans at dinner time, suggestions given to switch to chicken or other low carb protein. Pt needs add'l rx of metformin to make it to [facility] visit 12/17 -- e-prescribed Metformin rx sent to [facility] pharmacy today. 0 of 4 FBS values are abnormal, range 88-91 3 of 9 2hr pp values are abnormal, range 102-122 Exercise: dances daily x15 minutes, walks often Recent food: Breakfast = omelette, tomatos, peppers, onion, 1 tortilla, water           Lunch = beef and veggie tacos, no [place], water           Dinner = beans, sausage, torilla, water           Snack = cake at [facility]: 5 bottles -Urine results with trace protein. BP today WNL. No UTI sx per pt. Pt denies preX s/sx, though advised if present to go to ER. Will continue to monitor urine at f/u NST visit in 3 days. -Will need 36 wk [employer] -- [facility] to coordinate upon transfer. -NST today d/t A2 GDM. Will need another one early next wk.      I reviewed  the patient's fetal monitoring: Baseline HR: 145 Variability:  moderate Accels:present Decels: none Contractions: no contractions A/P: Reactive NST. Reassured regarding fetal status.  - Urinalysis (Urine Dip)  3. Obesity affecting pregnancy in third trimester 14 lb 12.8 oz (6.713 kg)  4. Previous cesarean section 09/14/13 failed IOL with arrest of dilitation @ 9 cm -[facility] to make delivery plans upon transfer  5. Size-date discrepancy -Continues measuring large. Per 12/6 [facility] note: Last u/s 03/25/20 with  EFW=49%, AFI wnl, anatomy wnl.  36 wk u/s scheduled at [facility] per pt request; to be done at [facility] when pt transfers care  Preterm labor symptoms and general obstetric precautions including but not limited to vaginal bleeding, contractions, leaking of fluid and fetal movement were reviewed in detail with the patient. Please refer to After Visit Summary for other counseling recommendations.  Return in about 4 days (around 04/22/2020) for NST.  Future Appointments  Date Time Provider Department Center  04/22/2020  3:30 PM [facility] PROVIDER AC-MAT None    Ann Held, PA-C

## 2020-04-22 ENCOUNTER — Ambulatory Visit: Payer: Self-pay | Admitting: Advanced Practice Midwife

## 2020-04-22 ENCOUNTER — Other Ambulatory Visit: Payer: Self-pay

## 2020-04-22 VITALS — BP 112/73 | HR 73 | Temp 98.0°F | Wt 209.6 lb

## 2020-04-22 DIAGNOSIS — O24415 Gestational diabetes mellitus in pregnancy, controlled by oral hypoglycemic drugs: Secondary | ICD-10-CM

## 2020-04-22 DIAGNOSIS — O0990 Supervision of high risk pregnancy, unspecified, unspecified trimester: Secondary | ICD-10-CM

## 2020-04-22 LAB — URINALYSIS
Bilirubin, UA: NEGATIVE
Glucose, UA: NEGATIVE
Leukocytes,UA: NEGATIVE
Nitrite, UA: NEGATIVE
Protein,UA: NEGATIVE
Specific Gravity, UA: 1.03 (ref 1.005–1.030)
Urobilinogen, Ur: 0.2 mg/dL (ref 0.2–1.0)
pH, UA: 6 (ref 5.0–7.5)

## 2020-04-22 LAB — FETAL NONSTRESS TEST

## 2020-04-22 NOTE — Progress Notes (Signed)
NST monitoring initiated ~ 1610. Client aware of 04/25/2020 transfer of care appt at [facility] with diabetes education appt same day at [facility]. ESherren Kerns CNM reviewed urine dip and blood sugar log. Following review of NST strip, monitoring discontinued after ~28 minutes. Jossie Ng, RN

## 2020-04-22 NOTE — Progress Notes (Signed)
Patient here for NST at 34 6/7. BS log copied and patient sent to the lab for urine dip. Here with her son.Burt Knack, RN

## 2020-04-28 ENCOUNTER — Telehealth: Payer: Self-pay | Admitting: Family Medicine

## 2020-04-28 NOTE — Telephone Encounter (Signed)
Patient needs help with finance. She states that [place] is too expensive and she doesn't have the money. They told her to contact her nurse from [facility].

## 2020-04-28 NOTE — Telephone Encounter (Addendum)
Call to client with [facility] interpreter Marlene Yemen. Per client, unable to afford care at [facility]. Client counseled regarding [facility] application. Client plans to retrieve application from [facility] this pm as well as letter from [facility] regarding what her percentage pay was prior to [facility] transfer. Jossie Ng, RNClient never retrieved charity care application and [facility] percentage pay letter. Above mailed to client this am. Jossie Ng, RN

## 2020-12-15 IMAGING — US US RENAL
1 series · 14 of 25 positions shown · non-contrast
Comparison: None.

CLINICAL DATA: Initial evaluation for acute back pain, currently
pregnant.

EXAM:
RENAL / URINARY TRACT ULTRASOUND COMPLETE

[Series 1: us renal · 14 of 37 slices shown]
[im 1/37]
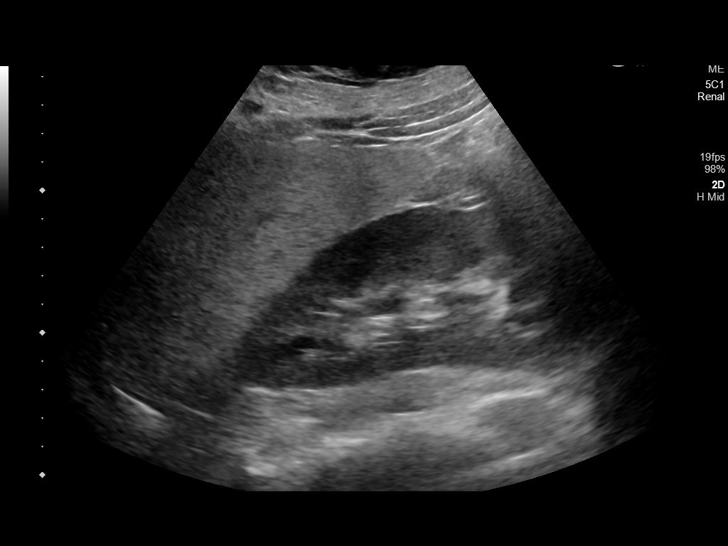
[im 4/37]
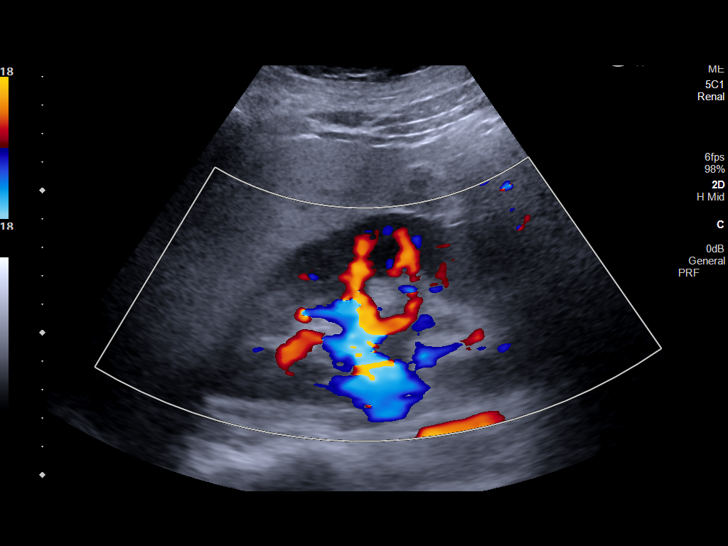
[im 7/37]
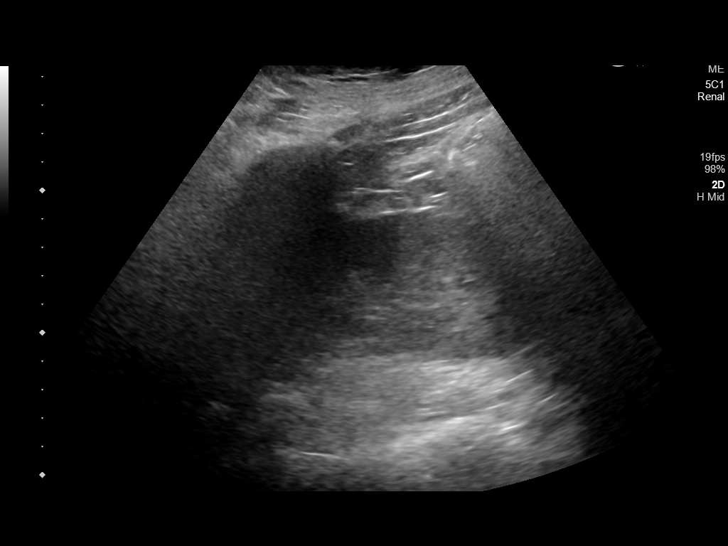
[im 10/37]
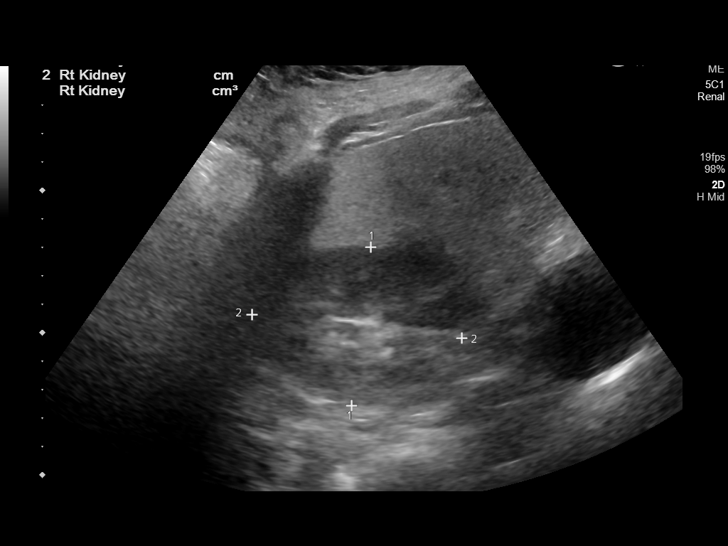
[im 13/37]
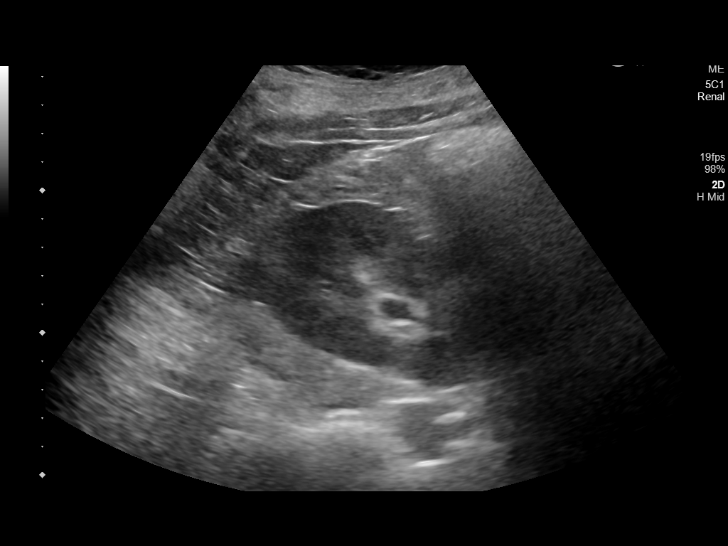
[im 14/37]
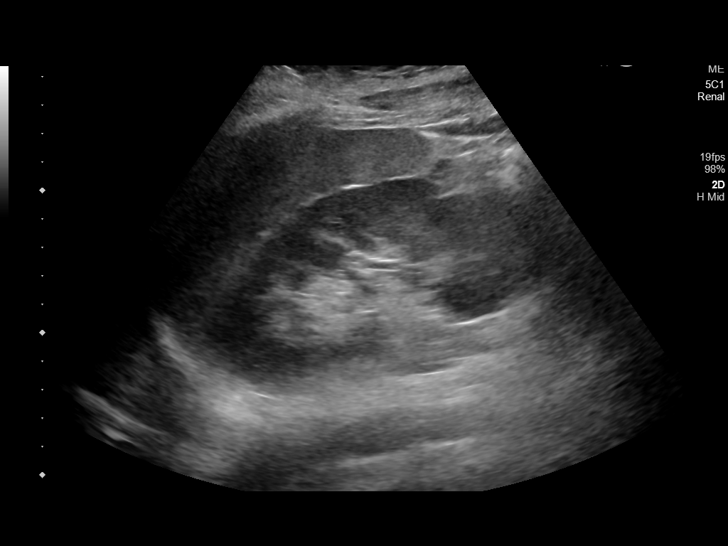
[im 17/37]
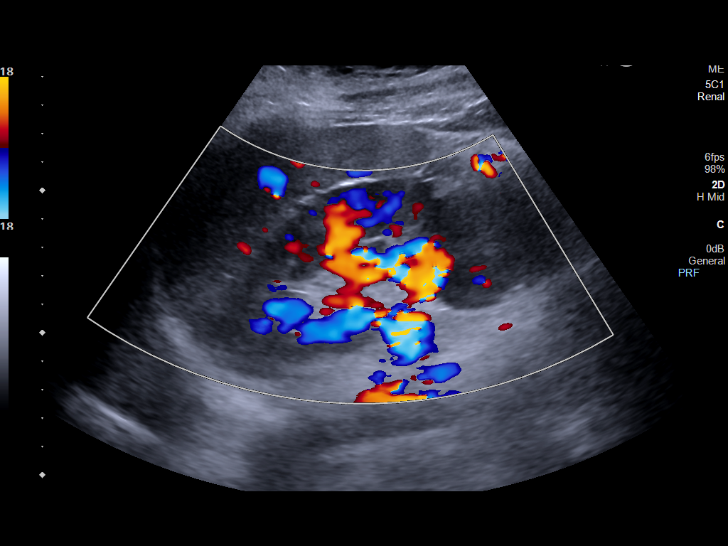
[im 20/37]
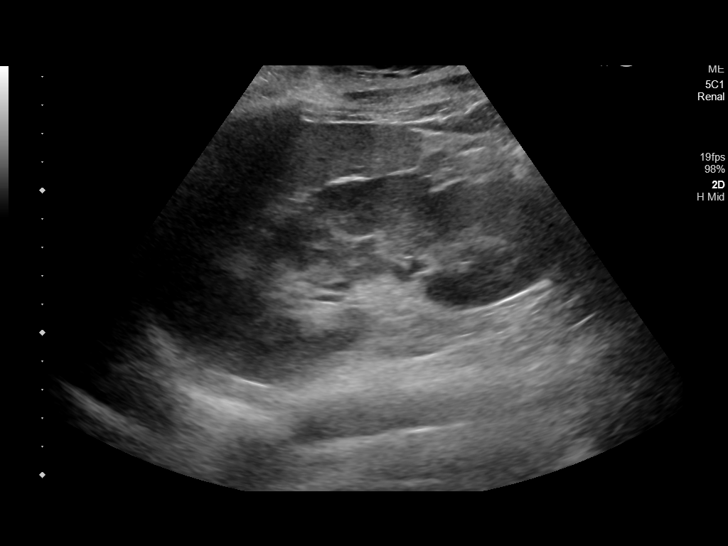
[im 23/37]
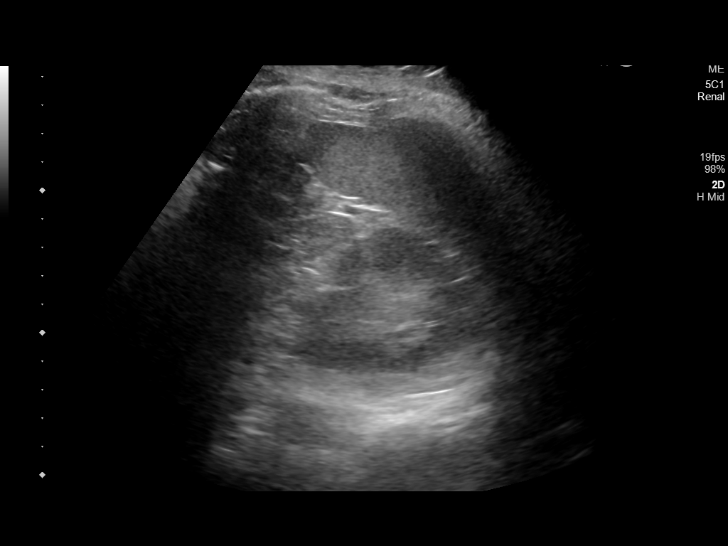
[im 25/37]
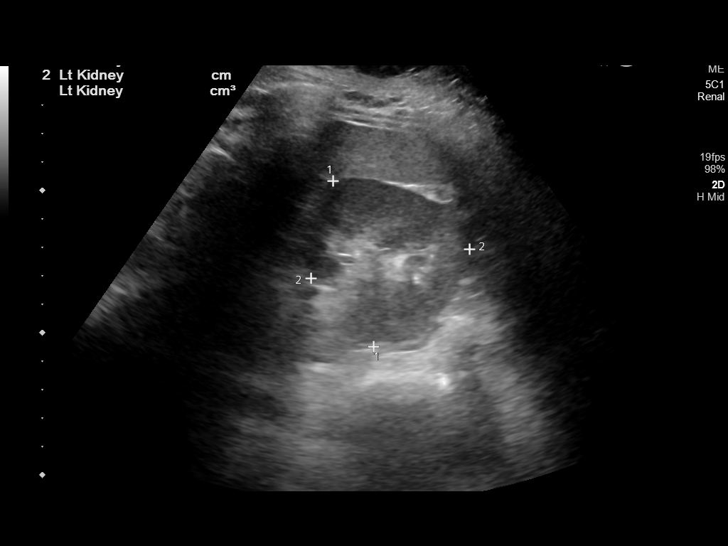
[im 28/37]
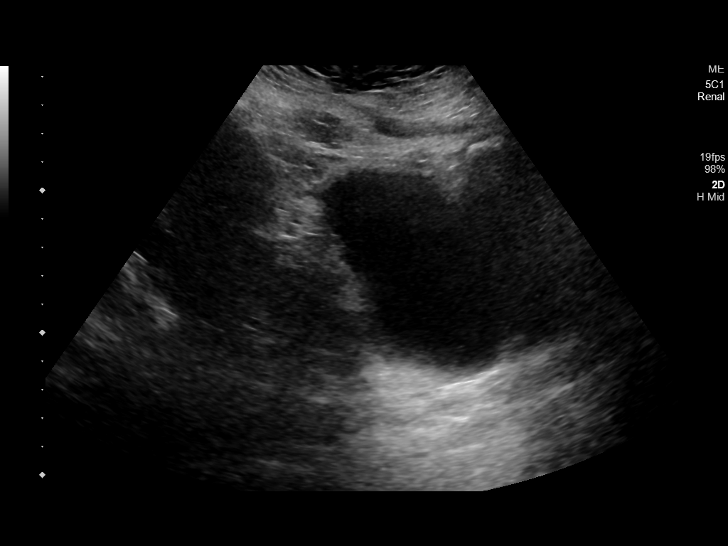
[im 31/37]
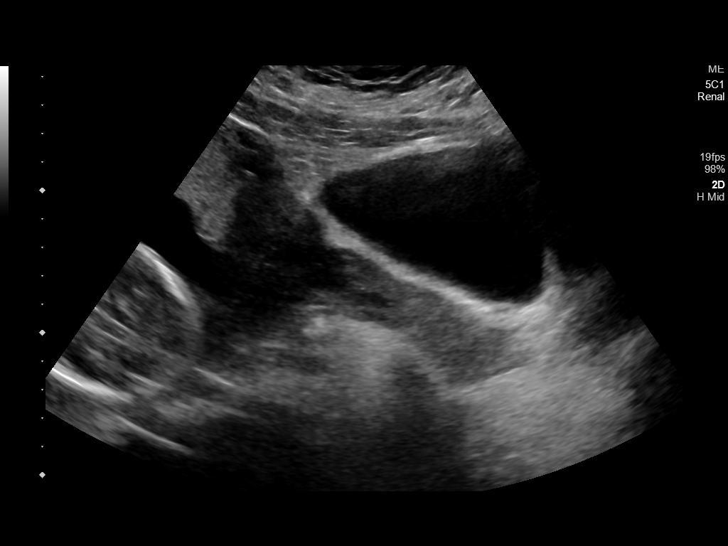
[im 34/37]
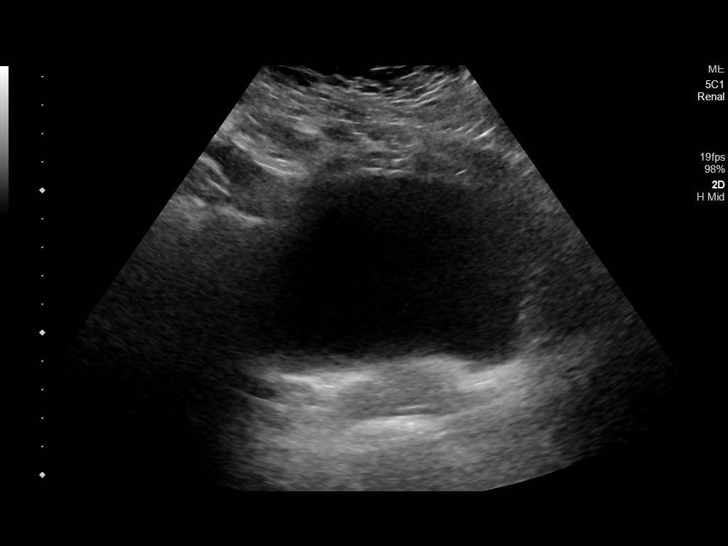
[im 37/37]
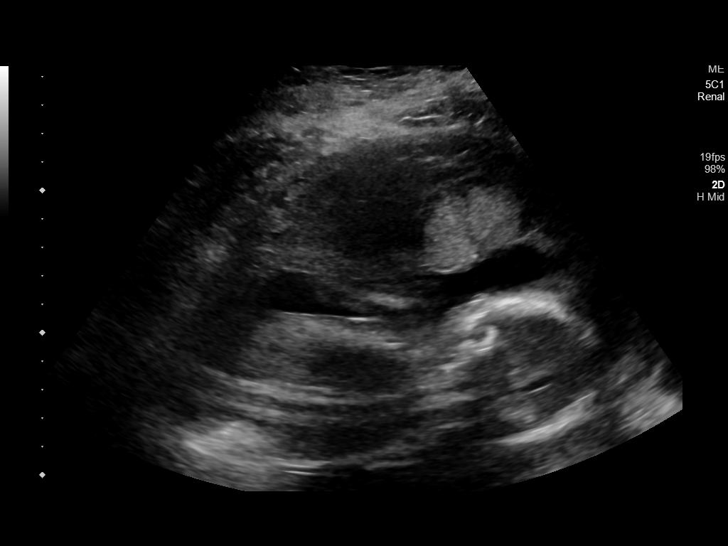

[14 of 25 positions shown; findings below may reference images not displayed]

FINDINGS: Right Kidney:

Renal measurements: 13.7 x 5.6 x 7.4 cm = volume: 297 mL. Renal
echogenicity within normal limits. No nephrolithiasis or
hydronephrosis. No focal renal mass.

Left Kidney:

Renal measurements: 12.8 x 6.0 x 5.7 cm = volume: 228 mL. Renal
echogenicity within normal limits. No nephrolithiasis or
hydronephrosis. No focal renal mass.

Bladder:

Appears normal for degree of bladder distention.

Other:

None.
IMPRESSION: Normal renal ultrasound. No nephrolithiasis or hydronephrosis.

## 2023-04-29 ENCOUNTER — Ambulatory Visit: Payer: Self-pay

## 2023-04-29 VITALS — BP 134/69 | Ht 62.0 in | Wt 211.5 lb

## 2023-04-29 DIAGNOSIS — Z3009 Encounter for other general counseling and advice on contraception: Secondary | ICD-10-CM

## 2023-04-29 DIAGNOSIS — Z3201 Encounter for pregnancy test, result positive: Secondary | ICD-10-CM

## 2023-04-29 LAB — PREGNANCY, URINE: Preg Test, Ur: POSITIVE — AB

## 2023-04-29 MED ORDER — PRENATAL 27-0.8 MG PO TABS: 1.0000 | ORAL_TABLET | Freq: Every day | ORAL | Status: AC

## 2023-04-29 NOTE — Progress Notes (Signed)
UPT positive. Plans on [facility] for prenatal care. To clerical for preadmit. Lang line used for interpreting .Lanny Cramp [id number]. Checked bp upon arrival 150/73. Rechecked it 134/69. Accompanied by both children and states she was alittle nervous because the pregnancy she was not expecting . States she is not on any birth control. Gave pt [employer] card . Stated she was feeling somewhat down and depressed . She states just recently waking up at 3am can't go back to sleep.   The patient was dispensed PNV #100tabs today. I provided counseling today regarding the medication. We discussed the medication, the side effects and when to call clinic. Patient given the opportunity to ask questions. Questions answered.

## 2023-06-03 ENCOUNTER — Encounter: Payer: Self-pay | Admitting: Nurse Practitioner

## 2023-06-03 ENCOUNTER — Ambulatory Visit: Payer: Medicaid Other | Admitting: Nurse Practitioner

## 2023-06-03 VITALS — BP 120/72 | HR 72 | Temp 97.5°F | Ht 62.0 in | Wt 208.4 lb

## 2023-06-03 DIAGNOSIS — Z23 Encounter for immunization: Secondary | ICD-10-CM | POA: Diagnosis not present

## 2023-06-03 DIAGNOSIS — O99210 Obesity complicating pregnancy, unspecified trimester: Secondary | ICD-10-CM

## 2023-06-03 DIAGNOSIS — O09522 Supervision of elderly multigravida, second trimester: Secondary | ICD-10-CM | POA: Diagnosis not present

## 2023-06-03 DIAGNOSIS — E66812 Obesity, class 2: Secondary | ICD-10-CM

## 2023-06-03 DIAGNOSIS — O99212 Obesity complicating pregnancy, second trimester: Secondary | ICD-10-CM

## 2023-06-03 DIAGNOSIS — Z6838 Body mass index (BMI) 38.0-38.9, adult: Secondary | ICD-10-CM

## 2023-06-03 DIAGNOSIS — O09299 Supervision of pregnancy with other poor reproductive or obstetric history, unspecified trimester: Secondary | ICD-10-CM | POA: Diagnosis not present

## 2023-06-03 DIAGNOSIS — Z3480 Encounter for supervision of other normal pregnancy, unspecified trimester: Secondary | ICD-10-CM | POA: Insufficient documentation

## 2023-06-03 DIAGNOSIS — Z8632 Personal history of gestational diabetes: Secondary | ICD-10-CM

## 2023-06-03 DIAGNOSIS — Z3482 Encounter for supervision of other normal pregnancy, second trimester: Secondary | ICD-10-CM | POA: Diagnosis not present

## 2023-06-03 DIAGNOSIS — R7611 Nonspecific reaction to tuberculin skin test without active tuberculosis: Secondary | ICD-10-CM

## 2023-06-03 DIAGNOSIS — Z98891 History of uterine scar from previous surgery: Secondary | ICD-10-CM

## 2023-06-03 DIAGNOSIS — Z3A13 13 weeks gestation of pregnancy: Secondary | ICD-10-CM

## 2023-06-03 DIAGNOSIS — O09529 Supervision of elderly multigravida, unspecified trimester: Secondary | ICD-10-CM

## 2023-06-03 DIAGNOSIS — Z8619 Personal history of other infectious and parasitic diseases: Secondary | ICD-10-CM

## 2023-06-03 LAB — HEMOGLOBIN, FINGERSTICK: Hemoglobin: 12.2 g/dL (ref 11.1–15.9)

## 2023-06-03 MED ORDER — ASPIRIN 81 MG PO TBEC: 81.0000 mg | DELAYED_RELEASE_TABLET | Freq: Every day | ORAL | Status: AC

## 2023-06-03 NOTE — Progress Notes (Addendum)
Presents for initiation of prenatal care and denies prior medical care thus far in pregnancy. Born [place] and has been in the [place] for 11 years with no international travel. Positive PPD in 01/2013 with incomplete treatment with Rifampin. Quantiferon TB Gold test 11/13/2019 with negative result. Desires MaterniT-21 testing today. Counseled on recommendation for flu vaccine in those 66 months of age or older. Desires vaccine today and tolerated without complaint. Jossie Ng, RN C-section operative reports printed from [facility] and in outguide for scanning. Jossie Ng, RN 4 week [facility] RV appt scheduled and reminder card given. Jossie Ng, RN Hgb = 12.2 and no intervention required per standing order. Jossie Ng, RN

## 2023-06-03 NOTE — Progress Notes (Signed)
[facility] [address] [place] [address] Main phone: [phone]  Initial Prenatal Visit  Subjective:  Sierra Dunlap is a 38 y.o. G3P2002 at [redacted]w[redacted]d being seen today to start prenatal care at the [facility]. 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  Contractions: Not present. Vag. Bleeding: None.  Movement: Absent. Denies leaking of fluid. She notes that at first she was a bit depressed because she did not desire a pregnancy, but that now she has come to terms with it and is alright with it. She is supported by her partner, Hildo, and her mother who lives close. 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  Indications for ASA therapy  One of the following: Previous pregnancy with preeclampsia, especially early onset and with an adverse outcome Yes  Multifetal gestation No  Chronic hypertension No  Type 1 or 2 diabetes mellitus No  Chronic kidney disease No  Autoimmune disease (antiphospholipid syndrome, systemic lupus erythematosus) No   Two or more of the following: Nulliparity  No  Obesity (body mass index >30 kg/m2) Yes  Family history of preeclampsia in mother or sister No  Age >=35 years Yes  Sociodemographic characteristics (African American race, low  socioeconomic level) Yes  Personal risk factors (eg, previous pregnancy with low birth weight or small for gestational age infant, previous adverse pregnancy outcome [eg, stillbirth], interval >10 years between pregnancies) Yes   The following portions of the patient's history were reviewed and updated as appropriate: allergies, current medications, past family history, past medical history, past social history, past surgical history and problem list. Problem list updated.  Objective:   Vitals:   06/03/23 0934  BP: 120/72  Pulse: 72  Temp: (!) 97.5 F (36.4 C)  Weight: 208 lb 6.4 oz (94.5 kg)   Fetal Status: Fetal Heart Rate (bpm): 141   Movement: Absent  Presentation: Undeterminable   Physical Exam Vitals and nursing note reviewed. Exam conducted with a chaperone present Roddie Mc).  Constitutional:      General: She is not in acute distress.    Appearance: Normal appearance. She is well-developed.  HENT:     Head: Normocephalic and atraumatic.     Right Ear: External ear normal.     Left Ear: External ear normal.     Nose: Nose normal. No congestion or rhinorrhea.     Mouth/Throat:     Lips: Pink.     Mouth: Mucous membranes are moist.     Dentition: Normal dentition. No dental caries.     Pharynx: Oropharynx is clear. Uvula midline.     Comments: Dentition: several silver caps on teeth Eyes:     General: No scleral icterus.    Conjunctiva/sclera: Conjunctivae normal.  Neck:     Thyroid: No thyroid mass or thyromegaly.  Cardiovascular:  Rate and Rhythm: Normal rate.     Comments: Extremities are warm and well perfused Pulmonary:     Effort: Pulmonary effort is normal.  Chest:     Chest wall: No mass.  Breasts:    Tanner Score is 5.     Breasts are symmetrical.     Right: Normal. No mass, nipple discharge or skin change.     Left: Normal. No mass, nipple discharge or skin change.  Abdominal:     General: Abdomen is flat.     Palpations: Abdomen is soft.      Tenderness: There is no abdominal tenderness.     Comments: Gravid   Genitourinary:    General: Normal vulva.     Exam position: Lithotomy position.     Pubic Area: No rash.      Labia:        Right: No rash.        Left: No rash.      Vagina: Normal. No vaginal discharge.     Cervix: No cervical motion tenderness or friability.     Uterus: Normal. Enlarged (Gravid 13 wk size). Not tender.      Adnexa: Right adnexa normal and left adnexa normal.     Rectum: Normal. No external hemorrhoid.     Comments: Swab and pap collected Ph <4 Musculoskeletal:     Right lower leg: No edema.     Left lower leg: No edema.  Lymphadenopathy:     Cervical: No cervical adenopathy.     Upper Body:     Right upper body: No axillary adenopathy.     Left upper body: No axillary adenopathy.  Skin:    General: Skin is warm.     Capillary Refill: Capillary refill takes less than 2 seconds.  Neurological:     Mental Status: She is alert.  Psychiatric:        Mood and Affect: Mood normal.        Behavior: Behavior normal.     Assessment and Plan:  Pregnancy: G3P2002 at [redacted]w[redacted]d  1. Supervision of other normal pregnancy, antepartum (Primary) Encouraged pt to seek dental care during pregnancy. Pt lives with her children and partner, Hildo. 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 She has support from her mother who lives nearby. [place] has been in the country for 11 years without traveling abroad. Discussed initial sadness surrounding discovery of pregnancy, offered CSW, Marchelle Folks, pt declined. She states her mood is better now. Denies any drug/alcohol use. Signed consent for UDS. No UDS collected, none needed. - Prenatal Profile I - Glucose, 1 hour - Comprehensive metabolic panel - Hgb A1c w/o eAG - TSH - Protein / creatinine ratio, urine - Lead, blood (adult age 37 yrs or greater) - MaterniT 21 plus Core, Blood - IGP, Aptima HPV  2. [redacted] weeks gestation of  pregnancy Pt is sure about LMP, fundal size is appropriate for GA Pt opts to just have anatomy US rather than dating [place]  3. Antepartum multigravida of advanced maternal age NIPS collected today Sent referral to [facility] for anatomy US and genetic counseling  4. History of gestational diabetes in prior pregnancy, currently pregnant Early glucola administered today  5. History of pre-eclampsia in prior pregnancy, currently pregnant Given ASA and educated on why to take daily  6. History of cesarean delivery Pt desires repeat cesarean with [facility] Will need delivery planning appt with [facility] around 28wks;  also desires BTL.  7. History of genital warts None visualized on exam today  8. Class 2 obesity with body mass index (BMI) of 38.0 to 38.9 in adult, unspecified obesity type, unspecified whether serious comorbidity present Reviewed desired weight gain in pregnancy and discussed healthy diet in pregnancy.  9. Previous positive PPD  Positive Ppd on 01/2013, chest x-ray normal, began Rifampin but d/c'd early d/t elevated LFT Had Quantiferon Gold test on 11/13/19 that was negative  Discussed overview of care and coordination with inpatient delivery practices including [facility],  [facility].   Preterm labor symptoms and general obstetric precautions including but not limited to vaginal bleeding, contractions, leaking of fluid and fetal movement were reviewed in detail with the patient.  Please refer to After Visit Summary for other counseling recommendations.   Due to language barrier, a Spanish interpreter Aaron Mose.) was present in person during the history-taking, subsequent discussion, and physical exam with this patient.    No follow-ups on file.  Future Appointments  Date Time Provider Department Center  07/01/2023  8:20 AM [facility] PROVIDER AC-MAT None    Alicia Amel, NP

## 2023-06-04 LAB — PROTEIN / CREATININE RATIO, URINE
Creatinine, Urine: 39.7 mg/dL
Protein, Ur: 9.6 mg/dL
Protein/Creat Ratio: 242 mg/g{creat} — ABNORMAL HIGH (ref 0–200)

## 2023-06-06 ENCOUNTER — Encounter: Payer: Self-pay | Admitting: Family Medicine

## 2023-06-06 DIAGNOSIS — R809 Proteinuria, unspecified: Secondary | ICD-10-CM | POA: Insufficient documentation

## 2023-06-07 ENCOUNTER — Encounter: Payer: Self-pay | Admitting: Family Medicine

## 2023-06-07 LAB — IGP, APTIMA HPV
HPV Aptima: NEGATIVE
PAP Smear Comment: 0

## 2023-06-08 ENCOUNTER — Encounter: Payer: Self-pay | Admitting: Family Medicine

## 2023-06-08 ENCOUNTER — Telehealth: Payer: Self-pay

## 2023-06-08 DIAGNOSIS — R8271 Bacteriuria: Secondary | ICD-10-CM | POA: Insufficient documentation

## 2023-06-08 LAB — TSH: TSH: 1.17 u[IU]/mL (ref 0.450–4.500)

## 2023-06-08 LAB — MATERNIT 21 PLUS CORE, BLOOD
Fetal Fraction: 16
Result (T21): NEGATIVE
Trisomy 13 (Patau syndrome): NEGATIVE
Trisomy 18 (Edwards syndrome): NEGATIVE
Trisomy 21 (Down syndrome): NEGATIVE

## 2023-06-08 LAB — PREGNANCY, INITIAL SCREEN
Antibody Screen: NEGATIVE
Basophils Absolute: 0 10*3/uL (ref 0.0–0.2)
Basos: 1 %
Bilirubin, UA: NEGATIVE
Chlamydia trachomatis, NAA: NEGATIVE
EOS (ABSOLUTE): 0.2 10*3/uL (ref 0.0–0.4)
Eos: 2 %
Glucose, UA: NEGATIVE
HCV Ab: NONREACTIVE
HIV Screen 4th Generation wRfx: NONREACTIVE
Hematocrit: 37.4 % (ref 34.0–46.6)
Hemoglobin: 12.3 g/dL (ref 11.1–15.9)
Hepatitis B Surface Ag: NEGATIVE
Immature Grans (Abs): 0 10*3/uL (ref 0.0–0.1)
Immature Granulocytes: 0 %
Ketones, UA: NEGATIVE
Leukocytes,UA: NEGATIVE
Lymphocytes Absolute: 2.3 10*3/uL (ref 0.7–3.1)
Lymphs: 28 %
MCH: 26.8 pg (ref 26.6–33.0)
MCHC: 32.9 g/dL (ref 31.5–35.7)
MCV: 82 fL (ref 79–97)
Monocytes Absolute: 0.5 10*3/uL (ref 0.1–0.9)
Monocytes: 5 %
Neisseria Gonorrhoeae by PCR: NEGATIVE
Neutrophils Absolute: 5.3 10*3/uL (ref 1.4–7.0)
Neutrophils: 64 %
Nitrite, UA: NEGATIVE
Platelets: 362 10*3/uL (ref 150–450)
Protein,UA: NEGATIVE
RBC, UA: NEGATIVE
RBC: 4.59 x10E6/uL (ref 3.77–5.28)
RDW: 16.5 % — ABNORMAL HIGH (ref 11.7–15.4)
RPR Ser Ql: NONREACTIVE
Rh Factor: POSITIVE
Rubella Antibodies, IGG: 5.87 {index} (ref >0.99)
Specific Gravity, UA: 1.01 (ref 1.005–1.030)
Urobilinogen, Ur: 0.2 mg/dL (ref 0.2–1.0)
WBC: 8.3 10*3/uL (ref 3.4–10.8)
pH, UA: 7.5 (ref 5.0–7.5)

## 2023-06-08 LAB — COMPREHENSIVE METABOLIC PANEL
ALT: 19 [IU]/L (ref 0–32)
AST: 15 [IU]/L (ref 0–40)
Albumin: 4 g/dL (ref 3.9–4.9)
Alkaline Phosphatase: 56 [IU]/L (ref 44–121)
BUN/Creatinine Ratio: 12 (ref 9–23)
BUN: 5 mg/dL — ABNORMAL LOW (ref 6–20)
Bilirubin Total: <0.2 mg/dL (ref 0.0–1.2)
CO2: 21 mmol/L (ref 20–29)
Calcium: 8.9 mg/dL (ref 8.7–10.2)
Chloride: 100 mmol/L (ref 96–106)
Creatinine, Ser: 0.42 mg/dL — ABNORMAL LOW (ref 0.57–1.00)
Globulin, Total: 3 g/dL (ref 1.5–4.5)
Glucose: 145 mg/dL — ABNORMAL HIGH (ref 70–99)
Potassium: 4.1 mmol/L (ref 3.5–5.2)
Sodium: 136 mmol/L (ref 134–144)
Total Protein: 7 g/dL (ref 6.0–8.5)
eGFR: 128 mL/min/{1.73_m2} (ref >59)

## 2023-06-08 LAB — MICROSCOPIC EXAMINATION
Bacteria, UA: NONE SEEN
Casts: NONE SEEN /[LPF]
RBC, Urine: NONE SEEN /[HPF] (ref 0–2)
WBC, UA: NONE SEEN /[HPF] (ref 0–5)

## 2023-06-08 LAB — LEAD, BLOOD (ADULT >= 16 YRS): Lead-Whole Blood: <1 ug/dL (ref 0.0–3.4)

## 2023-06-08 LAB — HCV INTERPRETATION

## 2023-06-08 LAB — URINE CULTURE, OB REFLEX

## 2023-06-08 LAB — HGB A1C W/O EAG: Hgb A1c MFr Bld: 6.1 % — ABNORMAL HIGH (ref 4.8–5.6)

## 2023-06-08 LAB — GLUCOSE, 1 HOUR GESTATIONAL: Gestational Diabetes Screen: 137 mg/dL (ref 70–139)

## 2023-06-08 NOTE — Telephone Encounter (Signed)
Phone call with assistance from M. Yemen to patient to explain need for and to schedule 3hgtt. Patient scheduled for 3hgtt for 06/10/23 @ 8:50 (8:35). Instructed patient to arrive at 8:30 for check in 06/10/23 and not to eat or drink anything after midnight the night before and not until after blood draws x4 complete. Patient verbalized understanding of above. Tawny Hopping, RN

## 2023-06-10 ENCOUNTER — Other Ambulatory Visit: Payer: Self-pay

## 2023-06-10 DIAGNOSIS — O99810 Abnormal glucose complicating pregnancy: Secondary | ICD-10-CM | POA: Diagnosis not present

## 2023-06-10 NOTE — Progress Notes (Signed)
In nurse clinic for 3hr GTT. Reports NPO since last night at 8PM. Instructions given for test today. Advised to notify RN if N/V. Next [facility] appt 07/01/2023; patient has reminder.   Abagail Kitchens, RN

## 2023-06-11 LAB — GESTATIONAL GLUCOSE TOLERANCE
Glucose, Fasting: 95 mg/dL — ABNORMAL HIGH (ref 70–94)
Glucose, GTT - 1 Hour: 218 mg/dL — ABNORMAL HIGH (ref 70–179)
Glucose, GTT - 2 Hour: 167 mg/dL — ABNORMAL HIGH (ref 70–154)
Glucose, GTT - 3 Hour: 138 mg/dL (ref 70–139)

## 2023-06-13 ENCOUNTER — Encounter: Payer: Self-pay | Admitting: Advanced Practice Midwife

## 2023-06-13 ENCOUNTER — Other Ambulatory Visit: Payer: Self-pay | Admitting: Advanced Practice Midwife

## 2023-06-13 ENCOUNTER — Telehealth: Payer: Self-pay

## 2023-06-13 DIAGNOSIS — E119 Type 2 diabetes mellitus without complications: Secondary | ICD-10-CM

## 2023-06-13 DIAGNOSIS — O99810 Abnormal glucose complicating pregnancy: Secondary | ICD-10-CM | POA: Insufficient documentation

## 2023-06-13 MED ORDER — LANCETS MISC: 1.0000 [IU] | Freq: Four times a day (QID) | 12 refills | Status: AC

## 2023-06-13 MED ORDER — BLOOD GLUCOSE MONITORING SUPPL DEVI: 1.0000 | Freq: Three times a day (TID) | 0 refills | Status: AC

## 2023-06-13 MED ORDER — BLOOD GLUCOSE TEST VI STRP: 1.0000 | ORAL_STRIP | Freq: Three times a day (TID) | 0 refills | Status: AC

## 2023-06-13 MED ORDER — LANCET DEVICE MISC: 1.0000 | Freq: Three times a day (TID) | 0 refills | Status: AC

## 2023-06-13 MED ORDER — BLOOD GLUCOSE TEST VI STRP: 100.0000 | ORAL_STRIP | Freq: Four times a day (QID) | 12 refills | Status: AC

## 2023-06-13 MED ORDER — LANCETS MISC. MISC: 1.0000 | Freq: Three times a day (TID) | 0 refills | Status: AC

## 2023-06-13 NOTE — Telephone Encounter (Signed)
Call to client with [facility] Interpreters ID # [phone] to counsel on need for GBS UTI treatment. Per client can only come 06/17/23 after 1530 this week for appt. Appt scheduled for arrival time of 1545 and aware clinic will work her in. Also counseled, per E. Sciora CNM, that diabetic at 14 weeks and needs transfer of care to [facility] for high risk condition. Client aware to keep previously scheduled 07/01/23 [facility] RV appt if does not have [facility] provider appt prior. Aware appt will be cancelled if seen at [facility] before that date. Client aware will receive diabetic testing supply kit at 06/17/23 appt to take with her to [facility]. Jossie Ng, RN

## 2023-06-13 NOTE — Telephone Encounter (Signed)
Per Dr. Larita Fife result note, client needs UTI treatment. Call to client  with Marlene Yemen to schedule OB problem appt Left message t call regarding lab results and number to call provided. Jossie Ng, RN

## 2023-06-17 ENCOUNTER — Ambulatory Visit: Payer: Self-pay | Admitting: Family Medicine

## 2023-06-17 VITALS — BP 116/75 | HR 80 | Temp 97.8°F | Wt 209.2 lb

## 2023-06-17 DIAGNOSIS — R8271 Bacteriuria: Secondary | ICD-10-CM

## 2023-06-17 DIAGNOSIS — Z3A15 15 weeks gestation of pregnancy: Secondary | ICD-10-CM | POA: Diagnosis not present

## 2023-06-17 DIAGNOSIS — Z3482 Encounter for supervision of other normal pregnancy, second trimester: Secondary | ICD-10-CM

## 2023-06-17 DIAGNOSIS — Z3480 Encounter for supervision of other normal pregnancy, unspecified trimester: Secondary | ICD-10-CM

## 2023-06-17 DIAGNOSIS — O99810 Abnormal glucose complicating pregnancy: Secondary | ICD-10-CM | POA: Diagnosis not present

## 2023-06-17 DIAGNOSIS — N898 Other specified noninflammatory disorders of vagina: Secondary | ICD-10-CM

## 2023-06-17 MED ORDER — AMOXICILLIN 875 MG PO TABS: 875.0000 mg | ORAL_TABLET | Freq: Two times a day (BID) | ORAL | 0 refills | Status: AC

## 2023-06-17 MED ORDER — CLOTRIMAZOLE 1 % VA CREA: 1.0000 | TOPICAL_CREAM | Freq: Every day | VAGINAL | Status: AC

## 2023-06-17 NOTE — Progress Notes (Signed)
 Blood glucose monitoring kit given. 07/01/2023 OB [facility] appointment is scheduled per [facility]. BTHIELE RN

## 2023-06-18 ENCOUNTER — Encounter: Payer: Self-pay | Admitting: Family Medicine

## 2023-06-18 NOTE — Assessment & Plan Note (Signed)
 Will send script for amoxicillin  875 mg BID x 5 days. Because patient reports yeast infections with antibiotic use, will also prescribe clotrimazole  vaginal cream for use if classic yeast symptoms arise.

## 2023-06-18 NOTE — Assessment & Plan Note (Addendum)
 Doing well. BP normal at 116/75. TWG -12.8 oz (-0.363 kg). Taking prenatal vitamin daily and aspirin  81 mg daily. Discussed transfer of care of [facility] for pre-diabetic A1c and abnormal early 3 hr GTT. Patient has received her glucometer supplies in office today. She has first appointment with [facility] scheduled for 4 weeks from now.

## 2023-06-18 NOTE — Progress Notes (Signed)
 [facility] [address] [place] [address] Main phone: [phone]  Prenatal Visit  Subjective:  Sierra Dunlap is a 39 y.o. G3P2002 at [redacted]w[redacted]d being seen today for ongoing prenatal care.  She is currently monitored for the following issues for this high-risk pregnancy:   Patient Active Problem List   Diagnosis Date Noted   Abnormal maternal glucose tolerance, antepartum 06/03/23 06/13/2023   Diabetes mellitus without complication (HCC) 06/10/23 06/13/2023   GBS bacteriuria 06/08/2023   Protein in urine 06/06/2023   Supervision of other normal pregnancy, antepartum 06/03/2023   Gastroesophageal reflux disease 03/07/2020   Obesity affecting pregnancy BMI=38.1 11/13/2019   Previous cesarean section x2 11/13/2019   Hx pp hemorrhage with EBL: 1000 11/13/2019   Advanced maternal age in multigravida 38 yo 11/13/2019   Condyloma external  11/13/2019   Hx GDM class A1--with 2015 pregnancy with transfer to [facility] at 35 5/7 for uncontrolled GD 08/20/2013   Patient reports backache.  Contractions: Not present. Vag. Bleeding: None.  Movement: Absent (Has not yet begun this pregnancy). Denies leaking of fluid/ROM.   The following portions of the patient's history were reviewed and updated as appropriate: allergies, current medications, past family history, past medical history, past social history, past surgical history and problem list. Problem list updated.  Objective:   Vitals:   06/17/23 1550  BP: 116/75  Pulse: 80  Temp: 97.8 F (36.6 C)  Weight: 209 lb 3.2 oz (94.9 kg)   Fetal Status: Fetal Heart Rate (bpm): 146 Fundal Height: 15 cm Movement: Absent (Has not yet begun this pregnancy)     General:  Alert, oriented and cooperative. Patient is in no acute distress.  Skin: Skin is warm and dry. No rash noted.   Cardiovascular: Normal heart rate noted  Respiratory: Normal respiratory effort, no problems with respiration  noted  Abdomen: Soft, gravid, appropriate for gestational age.  Pain/Pressure: Absent     Pelvic: Cervical exam deferred        Extremities: Normal range of motion.     Mental Status: Normal mood and affect. Normal behavior. Normal judgment and thought content.   Assessment and Plan:  Pregnancy: G3P2002 at [redacted]w[redacted]d  [redacted] weeks gestation of pregnancy  Supervision of other normal pregnancy, antepartum Assessment & Plan: Doing well. BP normal at 116/75. TWG -12.8 oz (-0.363 kg). Taking prenatal vitamin daily and aspirin  81 mg daily. Discussed transfer of care of [facility] for pre-diabetic A1c and abnormal early 3 hr GTT. Patient has received her glucometer supplies in office today. She has first appointment with [facility] scheduled for 4 weeks from now.    GBS bacteriuria Assessment & Plan: Will send script for amoxicillin  875 mg BID x 5 days. Because patient reports yeast infections with antibiotic use, will also prescribe clotrimazole  vaginal cream for use if classic yeast symptoms arise.   Orders: -     Amoxicillin ; Take 1 tablet (875 mg total) by mouth 2 (two) times daily for 5 days.  Dispense: 10 tablet; Refill: 0 -     Clotrimazole ; Place 1 Applicatorful vaginally at bedtime for 7 days.  Abnormal maternal glucose tolerance, antepartum 06/03/23 Assessment & Plan: Transfer of care to [facility] given pre-existing diabetes. Has first appointment with [facility] in 4 weeks.    Preterm labor symptoms and general obstetric precautions including but not limited to vaginal bleeding, contractions, leaking of fluid and fetal movement were reviewed in detail with the patient.  Please refer to After  Visit Summary for other counseling recommendations.  No follow-ups on file.  Future Appointments  Date Time Provider Department Center  07/01/2023  8:20 AM [facility] PROVIDER [facility] None   Betsey CHRISTELLA Helling, MD

## 2023-06-18 NOTE — Assessment & Plan Note (Signed)
 Transfer of care to [facility] given pre-existing diabetes. Has first appointment with [facility] in 4 weeks.

## 2023-06-30 ENCOUNTER — Telehealth: Payer: Self-pay

## 2023-06-30 NOTE — Telephone Encounter (Signed)
TC to patient to let her know she does not need to keep her appointment tomorrow morning as she has a new OB appointment at [facility] tomorrow. Patient informed that her appointment here has been cancelled. Patient states understanding. Interpreter, M. [place]. Burt Knack, RN

## 2023-07-01 ENCOUNTER — Ambulatory Visit: Payer: Self-pay

## 2023-07-26 NOTE — Addendum Note (Signed)
 Addended by: Heywood Bene on: 07/26/2023 01:42 PM   Modules accepted: Orders
